# Patient Record
Sex: Female | Born: 1965 | Race: White | Hispanic: No | Smoking: Current every day smoker
Health system: Southern US, Community
[De-identification: ages and names within clinical notes are randomized; demographics above are authoritative.]

## PROBLEM LIST (undated history)

## (undated) DIAGNOSIS — F329 Major depressive disorder, single episode, unspecified: Secondary | ICD-10-CM

## (undated) DIAGNOSIS — J189 Pneumonia, unspecified organism: Secondary | ICD-10-CM

## (undated) DIAGNOSIS — G43909 Migraine, unspecified, not intractable, without status migrainosus: Secondary | ICD-10-CM

## (undated) DIAGNOSIS — F32A Depression, unspecified: Secondary | ICD-10-CM

## (undated) DIAGNOSIS — T07XXXA Unspecified multiple injuries, initial encounter: Secondary | ICD-10-CM

## (undated) DIAGNOSIS — M1990 Unspecified osteoarthritis, unspecified site: Secondary | ICD-10-CM

## (undated) DIAGNOSIS — C541 Malignant neoplasm of endometrium: Secondary | ICD-10-CM

## (undated) HISTORY — PX: TUBAL LIGATION: SHX77

## (undated) HISTORY — DX: Major depressive disorder, single episode, unspecified: F32.9

## (undated) HISTORY — DX: Depression, unspecified: F32.A

## (undated) HISTORY — DX: Unspecified osteoarthritis, unspecified site: M19.90

## (undated) HISTORY — DX: Migraine, unspecified, not intractable, without status migrainosus: G43.909

## (undated) HISTORY — DX: Unspecified multiple injuries, initial encounter: T07.XXXA

## (undated) HISTORY — PX: WRIST SURGERY: SHX841

---

## 2008-01-07 ENCOUNTER — Emergency Department (HOSPITAL_COMMUNITY): Admission: EM | Admit: 2008-01-07 | Discharge: 2008-01-07 | Payer: Self-pay | Admitting: Emergency Medicine

## 2010-10-09 ENCOUNTER — Ambulatory Visit (HOSPITAL_COMMUNITY): Admission: RE | Admit: 2010-10-09 | Discharge: 2010-10-09 | Payer: Self-pay | Admitting: Family Medicine

## 2011-05-01 ENCOUNTER — Other Ambulatory Visit (HOSPITAL_COMMUNITY): Payer: Self-pay | Admitting: Family Medicine

## 2011-05-01 DIAGNOSIS — Z09 Encounter for follow-up examination after completed treatment for conditions other than malignant neoplasm: Secondary | ICD-10-CM

## 2011-05-07 ENCOUNTER — Ambulatory Visit (HOSPITAL_COMMUNITY)
Admission: RE | Admit: 2011-05-07 | Discharge: 2011-05-07 | Disposition: A | Discharge status: Home / Self Care | Payer: PRIVATE HEALTH INSURANCE | Source: Ambulatory Visit | Attending: Family Medicine | Admitting: Family Medicine

## 2011-05-07 ENCOUNTER — Other Ambulatory Visit (HOSPITAL_COMMUNITY): Payer: Self-pay | Admitting: Family Medicine

## 2011-05-07 DIAGNOSIS — N6489 Other specified disorders of breast: Secondary | ICD-10-CM | POA: Insufficient documentation

## 2011-05-07 DIAGNOSIS — Z09 Encounter for follow-up examination after completed treatment for conditions other than malignant neoplasm: Secondary | ICD-10-CM | POA: Insufficient documentation

## 2018-07-22 ENCOUNTER — Other Ambulatory Visit: Payer: Self-pay

## 2018-07-22 ENCOUNTER — Emergency Department (HOSPITAL_COMMUNITY)
Admission: EM | Admit: 2018-07-22 | Discharge: 2018-07-22 | Disposition: A | Discharge status: Home / Self Care | Payer: Worker's Compensation | Attending: Emergency Medicine | Admitting: Emergency Medicine

## 2018-07-22 ENCOUNTER — Encounter (HOSPITAL_COMMUNITY): Payer: Self-pay | Admitting: Emergency Medicine

## 2018-07-22 ENCOUNTER — Emergency Department (HOSPITAL_COMMUNITY): Payer: Worker's Compensation

## 2018-07-22 DIAGNOSIS — W010XXA Fall on same level from slipping, tripping and stumbling without subsequent striking against object, initial encounter: Secondary | ICD-10-CM | POA: Diagnosis not present

## 2018-07-22 DIAGNOSIS — Y939 Activity, unspecified: Secondary | ICD-10-CM | POA: Insufficient documentation

## 2018-07-22 DIAGNOSIS — Y929 Unspecified place or not applicable: Secondary | ICD-10-CM | POA: Insufficient documentation

## 2018-07-22 DIAGNOSIS — S838X2A Sprain of other specified parts of left knee, initial encounter: Secondary | ICD-10-CM | POA: Diagnosis not present

## 2018-07-22 DIAGNOSIS — F17210 Nicotine dependence, cigarettes, uncomplicated: Secondary | ICD-10-CM | POA: Diagnosis not present

## 2018-07-22 DIAGNOSIS — Y990 Civilian activity done for income or pay: Secondary | ICD-10-CM | POA: Diagnosis not present

## 2018-07-22 DIAGNOSIS — S8992XA Unspecified injury of left lower leg, initial encounter: Secondary | ICD-10-CM | POA: Diagnosis present

## 2018-07-22 DIAGNOSIS — S8002XA Contusion of left knee, initial encounter: Secondary | ICD-10-CM

## 2018-07-22 MED ORDER — IBUPROFEN 600 MG PO TABS: 600.0000 mg | ORAL_TABLET | Freq: Four times a day (QID) | ORAL | 0 refills | Status: DC

## 2018-07-22 MED ORDER — HYDROXYZINE HCL 25 MG PO TABS
25.0000 mg | ORAL_TABLET | Freq: Once | ORAL | Status: AC
  Administered 2018-07-22: 25 mg via ORAL
  Filled 2018-07-22: qty 1

## 2018-07-22 MED ORDER — TRAMADOL HCL 50 MG PO TABS
100.0000 mg | ORAL_TABLET | Freq: Once | ORAL | Status: AC
  Administered 2018-07-22: 100 mg via ORAL
  Filled 2018-07-22: qty 2

## 2018-07-22 MED ORDER — IBUPROFEN 400 MG PO TABS
400.0000 mg | ORAL_TABLET | Freq: Once | ORAL | Status: AC
  Administered 2018-07-22: 400 mg via ORAL
  Filled 2018-07-22: qty 1

## 2018-07-22 MED ORDER — TRAMADOL HCL 50 MG PO TABS: 50.0000 mg | ORAL_TABLET | Freq: Four times a day (QID) | ORAL | 0 refills | Status: DC | PRN

## 2018-07-22 NOTE — Discharge Instructions (Signed)
Your x-ray is negative for fracture or dislocation.  There are some mild arthritis changes noted on your x-ray.  Your examination favors a contusion to your knee, and a sprain to your knee.  Please use the knee immobilizer over the next 5 to 7 days.  Use the crutches until you can safely apply weight to your left lower extremity.  Use ibuprofen 600 mg with breakfast, lunch, dinner, and at bedtime.  Use Ultram for more severe pain.  Please take this medication with food.  Please call Dr. Aline Brochure for orthopedic evaluation if this is not improving over the weekend.

## 2018-07-22 NOTE — ED Provider Notes (Signed)
[facility] EMERGENCY DEPARTMENT Provider Note   CSN: [id number] Arrival date & time: 07/22/18  1945     History   Chief Complaint Chief Complaint  Patient presents with  . Knee Pain    HPI Becky Sanchez is a 52 y.o. female.  Patient is a 52 year old female who presents to the emergency department with a complaint of left knee pain.  The patient states that while at work she fell from a standing position with most of her weight on the left knee.  This pain is getting progressively worse.  She cannot bear weight on the left lower extremity.  Patient denies any previous operations procedures involving the left lower extremity.  No other injury reported.  The history is provided by the patient.    History reviewed. No pertinent past medical history.  There are no active problems to display for this patient.   Past Surgical History:  Procedure Laterality Date  . TUBAL LIGATION    . WRIST SURGERY       OB History   None      Home Medications    Prior to Admission medications   Not on File    Family History No family history on file.  Social History Social History   Tobacco Use  . Smoking status: Current Every Day Smoker  . Smokeless tobacco: Never Used  Substance Use Topics  . Alcohol use: Not Currently  . Drug use: Never     Allergies   Percocet [oxycodone-acetaminophen]   Review of Systems Review of Systems  Constitutional: Negative for activity change.       All ROS Neg except as noted in HPI  HENT: Negative for nosebleeds.   Eyes: Negative for photophobia and discharge.  Respiratory: Negative for cough, shortness of breath and wheezing.   Cardiovascular: Negative for chest pain and palpitations.  Gastrointestinal: Negative for abdominal pain and blood in stool.  Genitourinary: Negative for dysuria, frequency and hematuria.  Musculoskeletal: Positive for arthralgias. Negative for back pain and neck pain.  Skin: Negative.   Neurological:  Negative for dizziness, seizures and speech difficulty.  Psychiatric/Behavioral: Negative for confusion and hallucinations.     Physical Exam Updated Vital Signs BP (!) 130/93 (BP Location: Right Arm)   Pulse 84   Temp 98.3 F (36.8 C) (Oral)   Resp 16   Ht 5\' 2"  (1.575 m)   Wt 68 kg (150 lb)   SpO2 98%   BMI 27.44 kg/m   Physical Exam  Constitutional: Vital signs are normal. She appears well-developed and well-nourished. She is active.  HENT:  Head: Normocephalic and atraumatic.  Right Ear: Tympanic membrane, external ear and ear canal normal.  Left Ear: Tympanic membrane, external ear and ear canal normal.  Nose: Nose normal.  Mouth/Throat: Uvula is midline, oropharynx is clear and moist and mucous membranes are normal.  Eyes: Pupils are equal, round, and reactive to light. Conjunctivae, EOM and lids are normal.  Neck: Trachea normal, normal range of motion and phonation normal. Neck supple. Carotid bruit is not present.  Cardiovascular: Normal rate, regular rhythm and normal pulses.  Abdominal: Soft. Normal appearance and bowel sounds are normal.  Musculoskeletal: She exhibits tenderness.  There is good range of motion of the left hip.  There is pain with attempted range of motion of the left knee.  There is no posterior mass appreciated.  There is mild swelling of the anterior tibial tuberosity.  There is no deformity of the quadricep area, and  no effusion of the knee.  The Achilles tendon is intact.  The dorsalis pedis pulses 2+ on the left.  Lymphadenopathy:       Head (right side): No submental, no preauricular and no posterior auricular adenopathy present.       Head (left side): No submental, no preauricular and no posterior auricular adenopathy present.    She has no cervical adenopathy.  Neurological: She is alert. She has normal strength. No cranial nerve deficit or sensory deficit. GCS eye subscore is 4. GCS verbal subscore is 5. GCS motor subscore is 6.  Skin: Skin  is warm and dry.  Psychiatric: Her speech is normal.     ED Treatments / Results  Labs (all labs ordered are listed, but only abnormal results are displayed) Labs Reviewed - No data to display  EKG None  Radiology Dg Knee Complete 4 Views Left  Result Date: 07/22/2018 CLINICAL DATA:  Left knee pain after fall at work. EXAM: LEFT KNEE - COMPLETE 4+ VIEW COMPARISON:  None. FINDINGS: No evidence of fracture, dislocation, or joint effusion. No evidence of arthropathy or other focal bone abnormality. Soft tissues are unremarkable. IMPRESSION: Normal left knee. Electronically Signed   By: Marijo Conception, M.D.   On: 07/22/2018 21:00    Procedures Procedures (including critical care time)  Medications Ordered in ED Medications  ibuprofen (ADVIL,MOTRIN) tablet 400 mg (has no administration in time range)  traMADol (ULTRAM) tablet 100 mg (has no administration in time range)  hydrOXYzine (ATARAX/VISTARIL) tablet 25 mg (has no administration in time range)     Initial Impression / Assessment and Plan / ED Course  I have reviewed the triage vital signs and the nursing notes.  Pertinent labs & imaging results that were available during my care of the patient were reviewed by me and considered in my medical decision making (see chart for details).       Final Clinical Impressions(s) / ED Diagnoses MDM  Vital signs reviewed.  Pulse oximetry is 98% on room air.  Within normal limits by my interpretation.  No gross neurovascular deficits appreciated of the lower extremities.  X-ray of the left knee is negative for fracture, dislocation, or effusion.  The examination suggest contusion to the knee and sprain of the left knee.  Patient was fitted with a knee immobilizer and crutches.  Ice pack was provided.  A prescription for ibuprofen 600 and Ultram 50 mg given to the patient.  The patient is to follow-up with Dr. Aline Brochure if this pain is not improving.   Final diagnoses:  Contusion of  left knee, initial encounter  Sprain of other ligament of left knee, initial encounter    ED Discharge Orders        Ordered    traMADol (ULTRAM) 50 MG tablet  Every 6 hours PRN     07/22/18 2220    ibuprofen (ADVIL,MOTRIN) 600 MG tablet  4 times daily     07/22/18 2220       Lily Kocher, PA-C 07/22/18 2235    Long, W[facility], MD 07/23/18 1452

## 2018-07-22 NOTE — ED Triage Notes (Signed)
Pt c/o left knee pain after falling at work. Pt states she tripped on some bags.

## 2018-08-16 ENCOUNTER — Telehealth: Payer: Self-pay | Admitting: Orthopedic Surgery

## 2018-08-16 NOTE — Telephone Encounter (Signed)
I've contacted patient and scheduled appointment per approval; awaiting form only. Patient aware of appointment.

## 2018-08-16 NOTE — Telephone Encounter (Signed)
Call received from worker's comp adjuster,  Nehemiah Settle, from [employer] - ph# [phone] / fax# [phone]; states following up on knee injury of this patient per visit to [facility] Emergency Room on  07/22/18.  Approving treatment for our clinic with either of our providers. Faxed our worker's comp set up form; appointment pending.

## 2018-08-17 NOTE — Telephone Encounter (Signed)
Forms and Claim information received: Claim # [id number], per adjuster Drenda Freeze, [employer], ph# [phone], [phone].  Adjuster and patient aware of appointment 08/24/18, 9:20a.m.

## 2018-08-24 ENCOUNTER — Encounter: Payer: Self-pay | Admitting: Orthopaedic Surgery

## 2018-08-24 ENCOUNTER — Ambulatory Visit (INDEPENDENT_AMBULATORY_CARE_PROVIDER_SITE_OTHER): Payer: Worker's Compensation | Admitting: Orthopaedic Surgery

## 2018-08-24 VITALS — BP 116/70 | HR 70 | Ht 62.0 in | Wt 161.0 lb

## 2018-08-24 DIAGNOSIS — M25562 Pain in left knee: Secondary | ICD-10-CM | POA: Diagnosis not present

## 2018-08-24 MED ORDER — NAPROXEN 500 MG PO TABS: 500.0000 mg | ORAL_TABLET | Freq: Two times a day (BID) | ORAL | 5 refills | Status: DC

## 2018-08-24 NOTE — Progress Notes (Signed)
Subjective:    Patient ID: Becky Sanchez, female    DOB: 04/15/1966, 52 y.o.   MRN: [id number]  HPI  She fell at work at [employer] in [place] on 07-22-18 around 6:30 pm and hurt her left knee.  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 Her right foot got tangled in the straps of the delivery bags.  She fell and landed on her left knee hurting the left knee.  She reported the injury to [facility], Freight forwarder on Duty. She was seen at [place] the night of the injury.  X-rays of the knee were done and were negative.  She was given ibuprofen and Ultram.  She continues to have left knee pain.  She has swelling and the tendency for the knee to give way.  She has worked but is working a less than normal schedule.  She denies prior problems with the knee.  She is taking two Aleve a day for the knee.  She has a history of migraines and is limited to what medicine she can take.  She has no redness, no numbness, no other injury.  I have reviewed the ER records, the X-rays and the X-rays report as well as the information from [employer]    Review of Systems  Constitutional: Positive for activity change.  Musculoskeletal: Positive for arthralgias, gait problem and joint swelling.  Neurological: Positive for headaches.  All other systems reviewed and are negative.  For Review of Systems, all other systems reviewed and are negative.  The following is a summary of the past history medically, past history surgically, known current medicines, social history and family history.  This information is gathered electronically by the computer from prior information and documentation.  I review this each visit and have found including this information at this point in the chart is beneficial and informative.   Past Medical History:  Diagnosis Date  . Arthritis   . Depression   . Fractures   . Migraine     Past Surgical History:  Procedure Laterality Date    . TUBAL LIGATION    . WRIST SURGERY      Current Outpatient Medications on File Prior to Visit  Medication Sig Dispense Refill  . ibuprofen (ADVIL,MOTRIN) 600 MG tablet Take 1 tablet (600 mg total) by mouth 4 (four) times daily. 30 tablet 0  . traMADol (ULTRAM) 50 MG tablet Take 1 tablet (50 mg total) by mouth every 6 (six) hours as needed. 12 tablet 0   No current facility-administered medications on file prior to visit.     Social History   Socioeconomic History  . Marital status: Divorced    Spouse name: Not on file  . Number of children: Not on file  . Years of education: Not on file  . Highest education level: Not on file  Occupational History  . Not on file  Social Needs  . Financial resource strain: Not on file  . Food insecurity:    Worry: Not on file    Inability: Not on file  . Transportation needs:    Medical: Not on file    Non-medical: Not on file  Tobacco Use  . Smoking status: Current Every Day Smoker  . Smokeless tobacco: Never Used  Substance and Sexual Activity  . Alcohol use: Not Currently  . Drug use: Never  . Sexual activity: Not on file  Lifestyle  . Physical activity:  Days per week: Not on file    Minutes per session: Not on file  . Stress: Not on file  Relationships  . Social connections:    Talks on phone: Not on file    Gets together: Not on file    Attends religious service: Not on file    Active member of club or organization: Not on file    Attends meetings of clubs or organizations: Not on file    Relationship status: Not on file  . Intimate partner violence:    Fear of current or ex partner: Not on file    Emotionally abused: Not on file    Physically abused: Not on file    Forced sexual activity: Not on file  Other Topics Concern  . Not on file  Social History Narrative  . Not on file    Family History  Problem Relation Age of Onset  . Thyroid disease Mother   . Heart disease Maternal Grandmother   . Alzheimer's  disease Maternal Grandmother   . Dementia Maternal Grandfather   . Cancer Paternal Grandfather     BP 116/70   Pulse 70   Ht 5\' 2"  (1.575 m)   Wt 161 lb (73 kg)   BMI 29.45 kg/m   Body mass index is 29.45 kg/m.     Objective:   Physical Exam  Constitutional: She is oriented to person, place, and time. She appears well-developed and well-nourished.  HENT:  Head: Normocephalic and atraumatic.  Eyes: Pupils are equal, round, and reactive to light. Conjunctivae and EOM are normal.  Neck: Normal range of motion. Neck supple.  Cardiovascular: Normal rate, regular rhythm and intact distal pulses.  Pulmonary/Chest: Effort normal.  Abdominal: Soft.  Musculoskeletal:       Left knee: Tenderness found. Medial joint line tenderness noted.       Legs: Neurological: She is alert and oriented to person, place, and time. She has normal reflexes. She displays normal reflexes. No cranial nerve deficit. She exhibits normal muscle tone. Coordination normal.  Skin: Skin is warm and dry.  Psychiatric: She has a normal mood and affect. Her behavior is normal. Judgment and thought content normal.          Assessment & Plan:   Encounter Diagnosis  Name Primary?  . Acute pain of left knee Yes   I have given Rx for Naprosyn 500 po bid pc.  Stop the Aleve.  Avoid squatting.  Return in two weeks.  Re-evaluate then. She might knee MRI if not improved.  Call if any problem.  Precautions discussed.   Electronically Signed Sanjuana Kava, MD 8/27/201910:37 AM

## 2018-09-07 ENCOUNTER — Ambulatory Visit: Payer: Self-pay | Admitting: Orthopaedic Surgery

## 2018-09-07 ENCOUNTER — Encounter: Payer: Self-pay | Admitting: Orthopaedic Surgery

## 2018-11-09 ENCOUNTER — Telehealth: Payer: Self-pay | Admitting: Orthopaedic Surgery

## 2018-11-09 NOTE — Telephone Encounter (Signed)
Call received from adjuster on worker's comp claim, Sherri S. Ph [phone] via voice mail. States following up on whether patient had ever returned for the follow up visit, which at time of initial visit, was to be seen in 2 weeks. I returned call and re-iterated as had been previously faxed and discussed by phone, that patient did not show for the 2-week follow up; therefore, only 1 visit, notes for which had been faxed (see in"Release of Information".  Patient had not responded to call or letter that was sent.

## 2020-01-08 IMAGING — DX DG KNEE COMPLETE 4+V*L*
4 series · 4 of 4 positions shown · non-contrast
Comparison: None.

CLINICAL DATA: Left knee pain after fall at work.

EXAM:
LEFT KNEE - COMPLETE 4+ VIEW

[knee ap (1 of 3)]
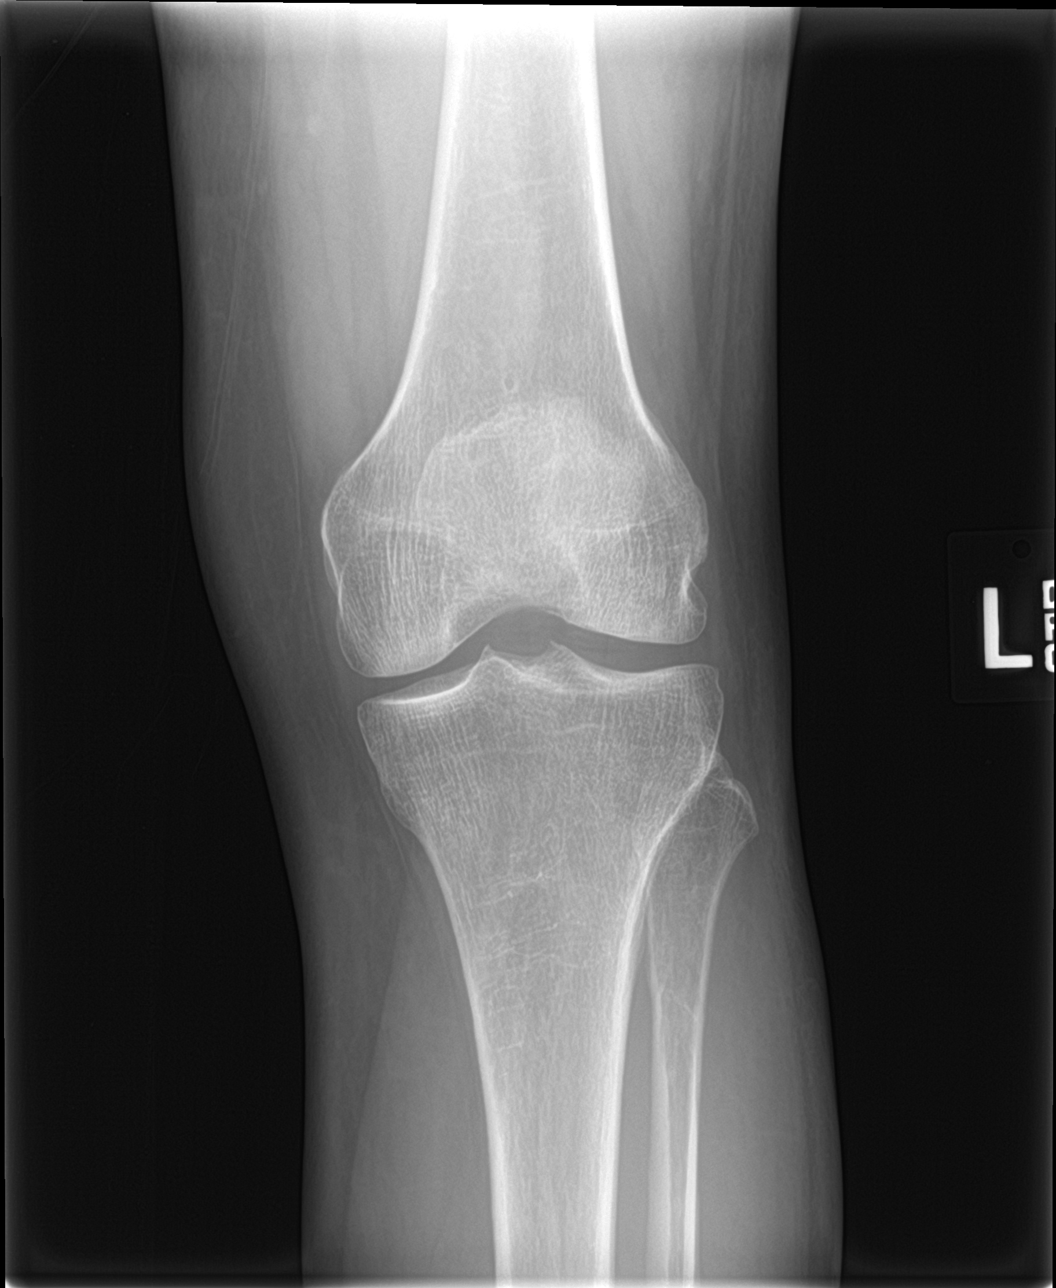

[knee lat]
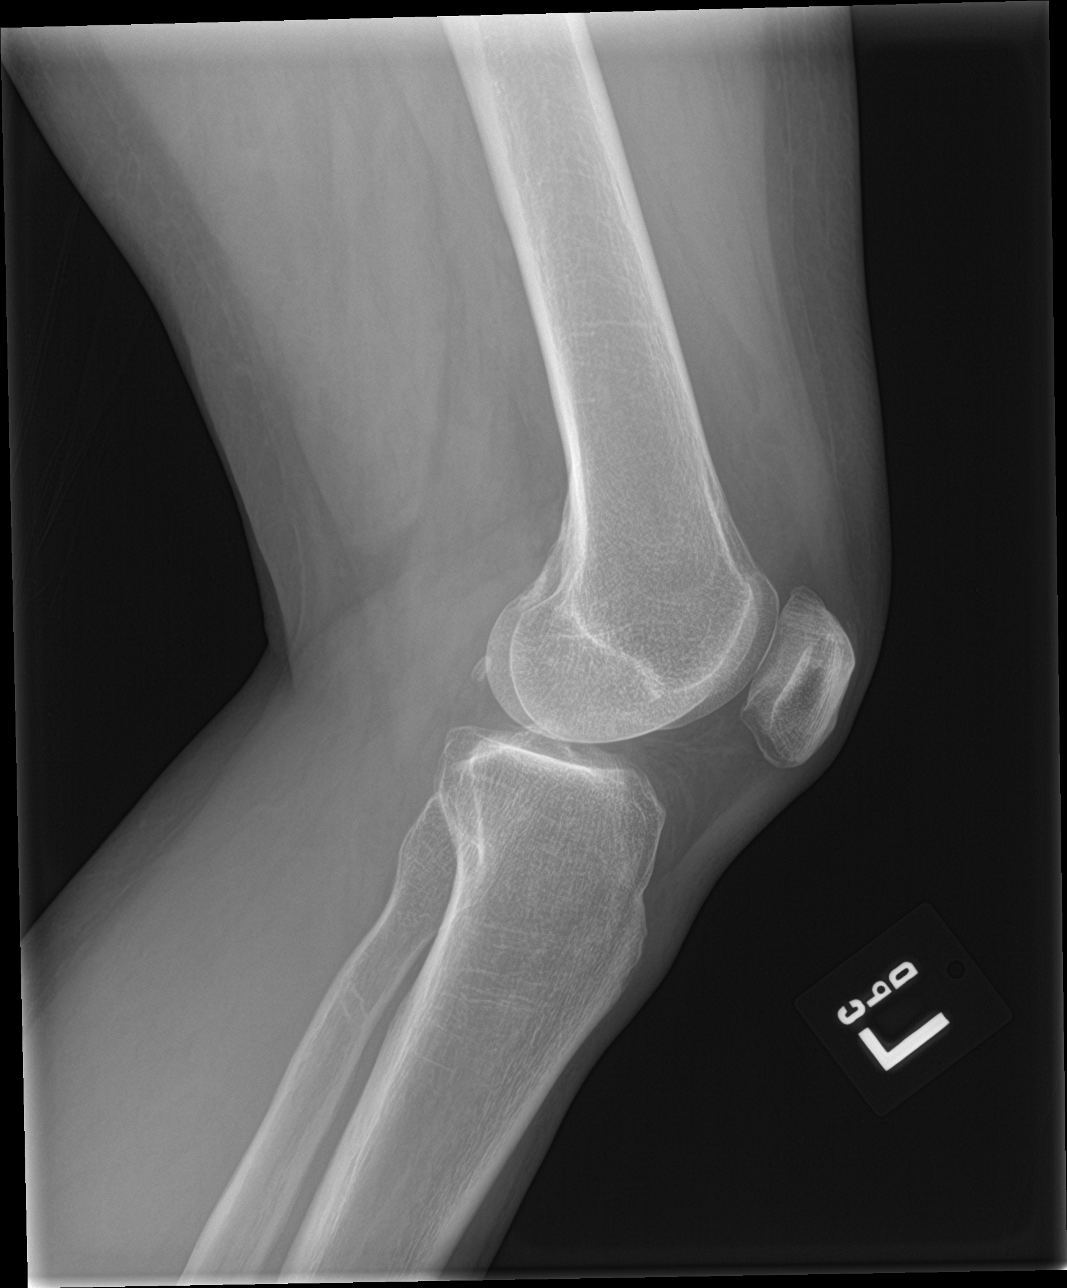

[knee ap (2 of 3)]
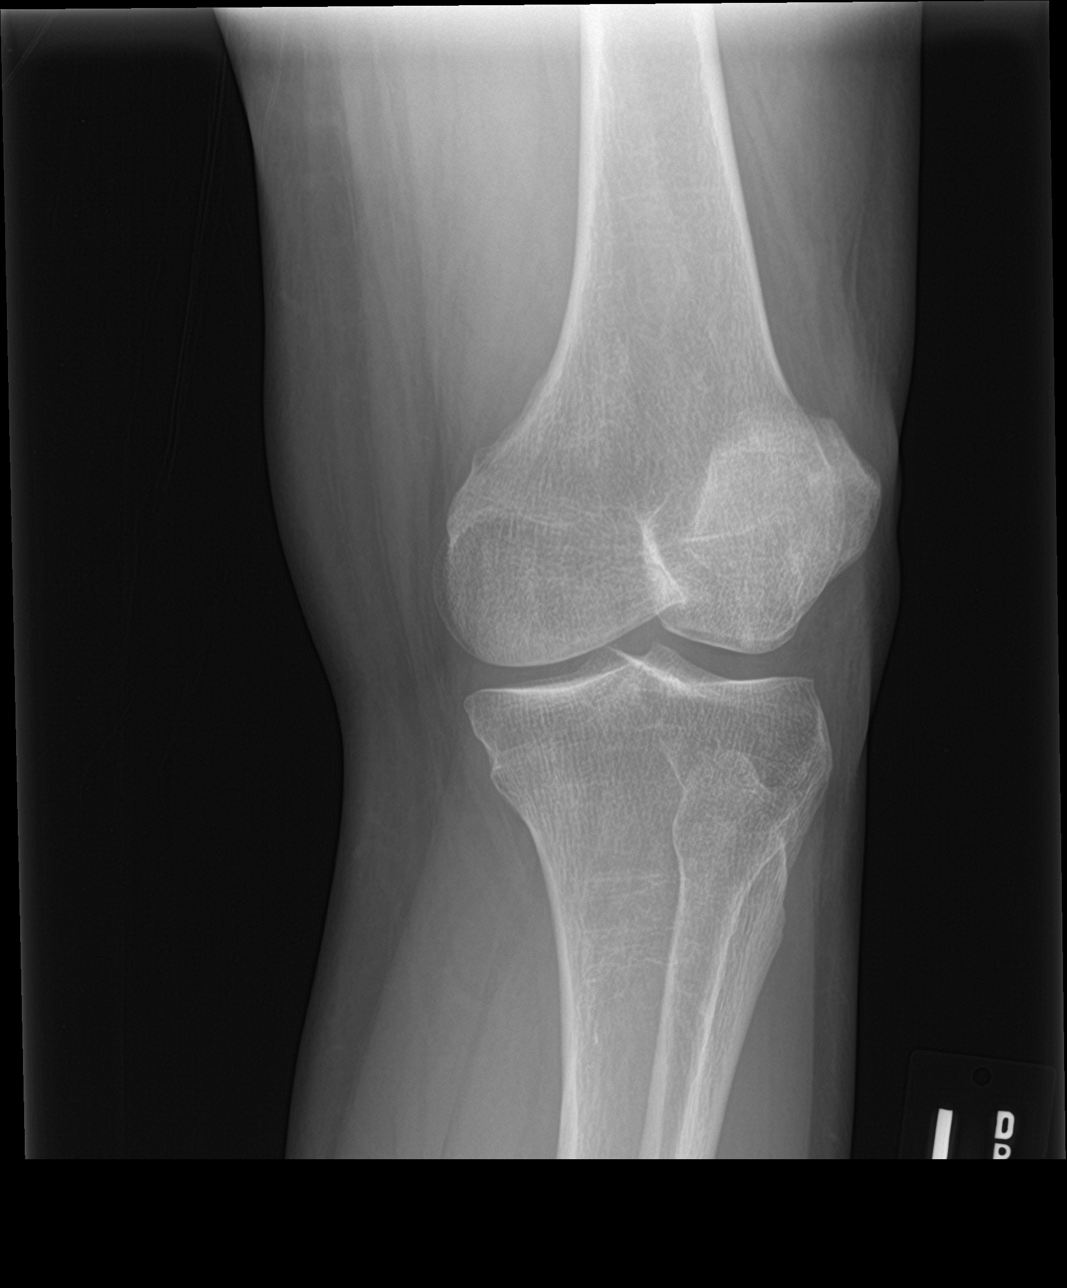

[knee ap (3 of 3)]
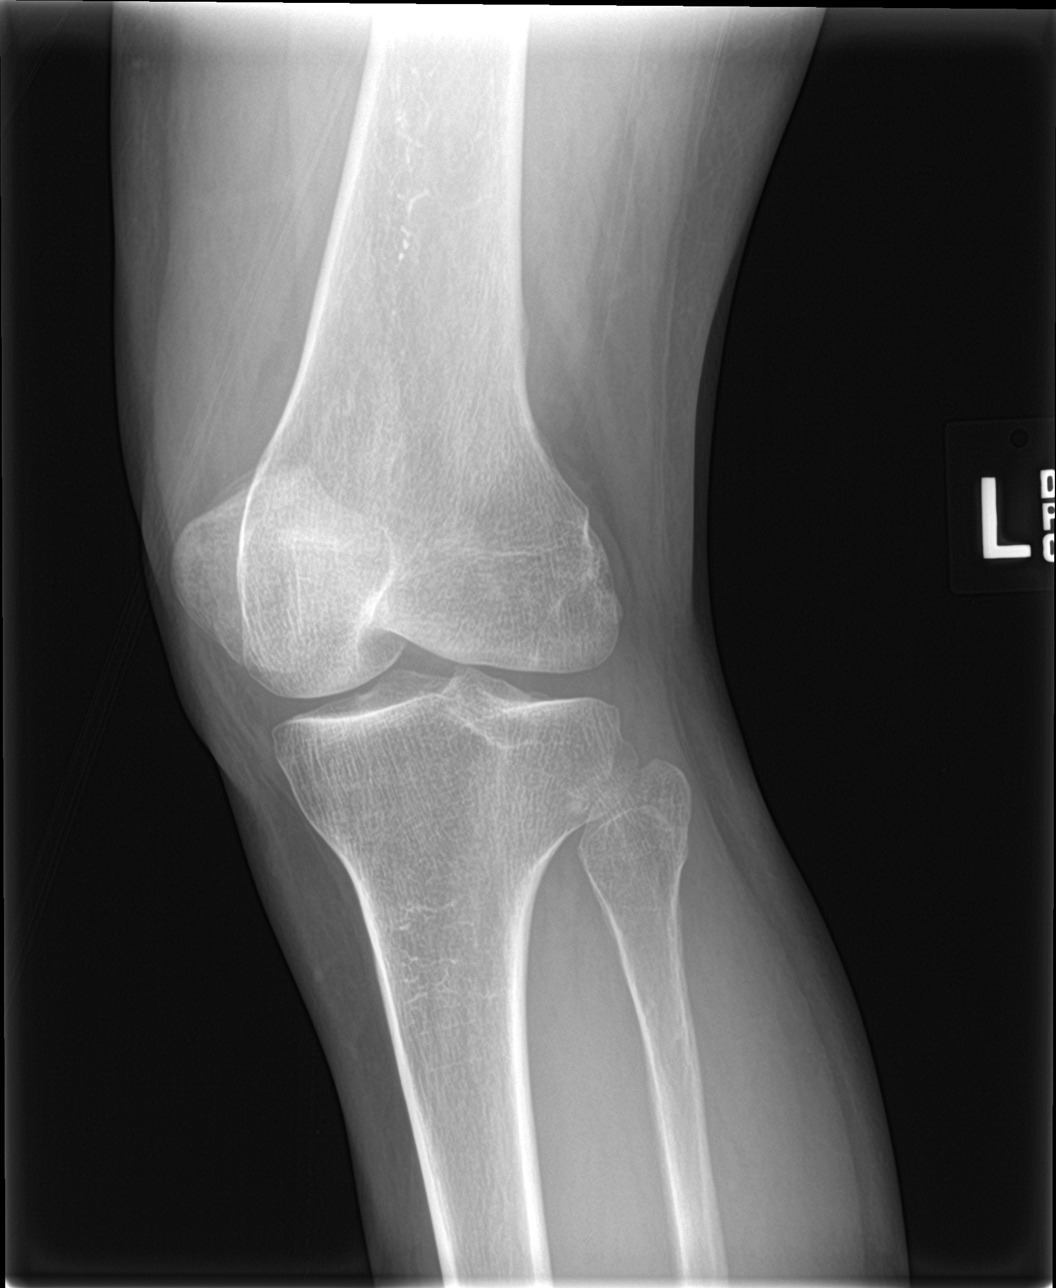

[4 of 4 positions shown; findings below may reference images not displayed]

FINDINGS: No evidence of fracture, dislocation, or joint effusion. No evidence
of arthropathy or other focal bone abnormality. Soft tissues are
unremarkable.
IMPRESSION: Normal left knee.

## 2023-01-09 ENCOUNTER — Telehealth: Payer: Self-pay | Admitting: *Deleted

## 2023-01-09 NOTE — Telephone Encounter (Signed)
Spoke with the patient regarding the referral to GYN oncology. Patient scheduled as new patient with Dr Ernestina Patches at 1/29 at 11/15 am. Patient given an arrival time of 10:45 am.   Explained to the patient the the doctor will perform a pelvic exam at this visit. Patient given the policy that no visitors under the 16 yrs are allowed in the [place]. Patient given the address/phone number for the clinic and that the center offers free valet service. Patient aware of the new mask mandate.

## 2023-01-22 ENCOUNTER — Encounter: Payer: Self-pay | Admitting: Psychiatry

## 2023-01-26 ENCOUNTER — Other Ambulatory Visit (HOSPITAL_COMMUNITY)
Admission: RE | Admit: 2023-01-26 | Discharge: 2023-01-26 | Disposition: A | Discharge status: Home / Self Care | Payer: PRIVATE HEALTH INSURANCE | Source: Ambulatory Visit | Attending: Psychiatry | Admitting: Psychiatry

## 2023-01-26 ENCOUNTER — Inpatient Hospital Stay (HOSPITAL_BASED_OUTPATIENT_CLINIC_OR_DEPARTMENT_OTHER): Payer: BC Managed Care – PPO | Admitting: Gynecologic Oncology

## 2023-01-26 ENCOUNTER — Inpatient Hospital Stay: Payer: BC Managed Care – PPO | Attending: Psychiatry | Admitting: Psychiatry

## 2023-01-26 ENCOUNTER — Encounter: Payer: Self-pay | Admitting: Psychiatry

## 2023-01-26 ENCOUNTER — Other Ambulatory Visit: Payer: Self-pay

## 2023-01-26 ENCOUNTER — Inpatient Hospital Stay: Payer: BC Managed Care – PPO

## 2023-01-26 VITALS — BP 138/85 | HR 84 | Temp 98.5°F | Resp 14 | Ht 62.21 in | Wt 163.0 lb

## 2023-01-26 DIAGNOSIS — Z720 Tobacco use: Secondary | ICD-10-CM

## 2023-01-26 DIAGNOSIS — Z8042 Family history of malignant neoplasm of prostate: Secondary | ICD-10-CM | POA: Diagnosis not present

## 2023-01-26 DIAGNOSIS — R350 Frequency of micturition: Secondary | ICD-10-CM

## 2023-01-26 DIAGNOSIS — R634 Abnormal weight loss: Secondary | ICD-10-CM | POA: Insufficient documentation

## 2023-01-26 DIAGNOSIS — C541 Malignant neoplasm of endometrium: Secondary | ICD-10-CM

## 2023-01-26 DIAGNOSIS — Z124 Encounter for screening for malignant neoplasm of cervix: Secondary | ICD-10-CM

## 2023-01-26 DIAGNOSIS — G893 Neoplasm related pain (acute) (chronic): Secondary | ICD-10-CM | POA: Diagnosis not present

## 2023-01-26 DIAGNOSIS — R6881 Early satiety: Secondary | ICD-10-CM | POA: Diagnosis not present

## 2023-01-26 DIAGNOSIS — N950 Postmenopausal bleeding: Secondary | ICD-10-CM | POA: Insufficient documentation

## 2023-01-26 DIAGNOSIS — F17210 Nicotine dependence, cigarettes, uncomplicated: Secondary | ICD-10-CM

## 2023-01-26 DIAGNOSIS — M1990 Unspecified osteoarthritis, unspecified site: Secondary | ICD-10-CM | POA: Diagnosis not present

## 2023-01-26 DIAGNOSIS — Z716 Tobacco abuse counseling: Secondary | ICD-10-CM

## 2023-01-26 LAB — URINALYSIS, COMPLETE (UACMP) WITH MICROSCOPIC
Bacteria, UA: NONE SEEN
Bilirubin Urine: NEGATIVE
Glucose, UA: NEGATIVE mg/dL
Hgb urine dipstick: NEGATIVE
Ketones, ur: NEGATIVE mg/dL
Nitrite: NEGATIVE
Protein, ur: NEGATIVE mg/dL
Specific Gravity, Urine: 1.009 (ref 1.005–1.030)
pH: 5 (ref 5.0–8.0)

## 2023-01-26 MED ORDER — SENNOSIDES-DOCUSATE SODIUM 8.6-50 MG PO TABS: 2.0000 | ORAL_TABLET | Freq: Every day | ORAL | 0 refills | Status: AC

## 2023-01-26 MED ORDER — NICOTINE POLACRILEX 2 MG MT LOZG: 2.0000 mg | LOZENGE | OROMUCOSAL | 3 refills | Status: DC | PRN

## 2023-01-26 MED ORDER — NICOTINE 21 MG/24HR TD PT24: 21.0000 mg | MEDICATED_PATCH | Freq: Every day | TRANSDERMAL | 3 refills | Status: DC

## 2023-01-26 MED ORDER — TRAMADOL HCL 50 MG PO TABS: 50.0000 mg | ORAL_TABLET | Freq: Four times a day (QID) | ORAL | 0 refills | Status: DC | PRN

## 2023-01-26 NOTE — H&P (View-Only) (Signed)
GYNECOLOGIC ONCOLOGY NEW PATIENT CONSULTATION  Date of Service: 01/26/2023 Referring Provider: Judy Pimple, NP [address] [place],  [place] [address]   ASSESSMENT AND PLAN: Becky Sanchez is a 57 y.o. woman with FIGO grade 1 endometrial cancer.  We reviewed the nature of endometrial cancer and its recommended surgical staging, including total hysterectomy, bilateral salpingo-oophorectomy, and lymph node assessment. The patient is a suitable candidate for staging via a minimally invasive approach to surgery.  We reviewed that robotic assistance would be used to complete the surgery.   We discussed that most endometrial cancer is detected early and that decisions regarding adjuvant therapy will be made based on her final pathology.   Given her symptoms of early satiety and weight loss as well as her postmenopausal bleeding for over 2 years, recommend CT abdomen/pelvis prior to surgery.  Patient aware that pending results of CT scan, we may or may not proceed with surgery and instead could require neoadjuvant treatment if distant metastatic disease.  We reviewed the sentinel lymph node technique. Risks and benefits of sentinel lymph node biopsy was reviewed. We reviewed the technique and ICG dye. The patient DOES NOT have an iodine allergy or known liver dysfunction. We reviewed the false negative rate (0.4%), and that 3% of patients with metastatic disease will not have it detected by SLN biopsy in endometrial cancer. A low risk of allergic reaction to the dye, <0.2% for ICG, has been reported. We also discussed that in the case of failed mapping, which occurs 40% of the time, a bilateral or unilateral lymphadenectomy will be performed at the surgeon's discretion.   Potential benefits of sentinel nodes including a higher detection rate for metastasis due to ultrastaging and potential reduction in operative morbidity. However, there remains uncertainty as to the role for treatment of micrometastatic  disease. Further, the benefit of operative morbidity associated with the SLN technique in endometrial cancer is not yet completely known. In other patient populations (e.g. the cervical cancer population) there has been observed reductions in morbidity with SLN biopsy compared to pelvic lymphadenectomy. Lymphedema, nerve dysfunction and lymphocysts are all potential risks with the SLN technique as with complete lymphadenectomy. Additional risks to the patient include the risk of damage to an internal organ while operating in an altered view (e.g. the black and white image of the robotic fluorescence imaging mode).   Patient was consented for: Robotic assisted total laparoscopic hysterectomy, bilateral salpingo-oophorectomy, sentinel lymph node evaluation and biopsy, possible lymph node dissection, possible laparotomy on 02/24/2023.  The risks of surgery were discussed in detail and she understands these to including but not limited to bleeding requiring a blood transfusion, infection, injury to adjacent organs (including but not limited to the bowels, bladder, ureters, nerves, blood vessels), thromboembolic events, wound separation, hernia, vaginal cuff separation, possible risk of lymphedema and lymphocyst if lymphadenectomy performed, unforseen complication, and possible need for re-exploration.  If the patient experiences any of these events, she understands that her hospitalization or recovery may be prolonged and that she may need to take additional medications for a prolonged period. The patient will receive DVT and antibiotic prophylaxis as indicated. She voiced a clear understanding. She had the opportunity to ask questions and written informed consent was obtained today. She wishes to proceed.  She will proceed to the lab today for urinalysis given urinary frequency. Also reviewed tobacco cessation.  Prescription for nicotine patch and lozenges prescribed. Also, unclear when patient's last Pap  smear was.  No Pap  smear is listed in chart.  Pap smear collected today. She does not require preoperative clearance. Her METs are >4.  All preoperative instructions were reviewed. Postoperative expectations were also reviewed.    A copy of this note was sent to the patient's referring provider.  [name], MD Gynecologic Oncology   Medical Decision Making I personally spent  TOTAL 55 minutes face-to-face and non-face-to-face in the care of this patient, which includes all pre, intra, and post visit time on the date of service.  ------------  CC: Endometrial cancer  HISTORY OF PRESENT ILLNESS:  Becky Sanchez is a 57 y.o. woman who is seen in consultation at the request of A, Victorino December, NP for evaluation of a new diagnosis of endometrial cancer.  Patient underwent an ultrasound on 09/10/2021 for 3 months of dysfunctional uterine bleeding and was noted at that time to a thickened endometrium was 20 mm.  The patient believes that the biopsy of some sort may have been collected at that time, however, no results are available to confirm this.  She has since presented to her OB/GYN for 2 years of postmenopausal bleeding. Patient underwent a biopsy in 01/05/2023 which returned with FIGO stage I endometrioid adenocarcinoma.  Today, patient reports light ongoing vaginal bleeding.  She also notes pelvic pain that she describes as feeling like a knot since her biopsy.  During that same time she notes increased urinary frequency but denies urinary urgency, dysuria, hematuria.  She endorses early satiety for several years and reports about a 10 pound weight loss over the past year without any coinciding lifestyle changes.  She otherwise denies abdominal bloating or change in bowel habits.  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  PAST MEDICAL HISTORY: Past Medical History:  Diagnosis Date   Arthritis    Depression    Fractures    Migraine     PAST  SURGICAL HISTORY: Past Surgical History:  Procedure Laterality Date   TUBAL LIGATION     WRIST SURGERY Right     OB/GYN HISTORY: OB History  Gravida Para Term Preterm AB Living  '2 2 2     2  '$ SAB IAB Ectopic Multiple Live Births          2    # Outcome Date GA Lbr Len/2nd Weight Sex Delivery Anes PTL Lv  2 Term      Vag-Spont   LIV  1 Term      Vag-Spont   LIV      Age at menarche: 89 or 58 Age at menopause: Unknown due to bleeding Hx of HRT: None Hx of STI: gonorrhea many years ago Last pap: unsure History of abnormal pap smears: none that she knows of  SCREENING STUDIES:  Last mammogram: 09/2010 Last colonoscopy: none  MEDICATIONS:  Current Outpatient Medications:    naproxen sodium (ALEVE) 220 MG tablet, Take 220 mg by mouth., Disp: , Rfl:    OVER THE COUNTER MEDICATION, Take by mouth daily. Move Free Joint health Advance, Disp: , Rfl:   ALLERGIES: Allergies  Allergen Reactions   Percocet [Oxycodone-Acetaminophen] Hives    FAMILY HISTORY: Family History  Problem Relation Age of Onset   Thyroid disease Mother    Heart disease Maternal Grandmother    Alzheimer's disease Maternal Grandmother    Dementia Maternal Grandfather    Prostate cancer Paternal Grandfather    Breast cancer Neg Hx    Ovarian cancer Neg Hx    Endometrial cancer  Neg Hx    Colon cancer Neg Hx     SOCIAL HISTORY: Social History   Socioeconomic History   Marital status: Divorced    Spouse name: Not on file   Number of children: Not on file   Years of education: Not on file   Highest education level: Not on file  Occupational History   Not on file  Tobacco Use   Smoking status: Every Day    Packs/day: 1.00    Years: 30.00    Total pack years: 30.00    Types: Cigarettes   Smokeless tobacco: Never  Substance and Sexual Activity   Alcohol use: Yes    Comment: Occ   Drug use: Never   Sexual activity: Yes  Other Topics Concern   Not on file  Social History Narrative   Not on  file   Social Determinants of Health   Financial Resource Strain: Not on file  Food Insecurity: Not on file  Transportation Needs: Not on file  Physical Activity: Not on file  Stress: Not on file  Social Connections: Not on file  Intimate Partner Violence: Not on file    REVIEW OF SYSTEMS: New patient intake form was reviewed.  Complete 10-system review is negative except for the following: Pelvic pain  PHYSICAL EXAM: BP 138/85 (BP Location: Left Arm, Patient Position: Sitting)   Pulse 84   Temp 98.5 F (36.9 C) (Oral)   Resp 14   Ht 5' 2.21" (1.58 m)   Wt 163 lb (73.9 kg)   SpO2 100%   BMI 29.62 kg/m  Constitutional: No acute distress. Neuro/Psych: Alert, oriented.  Head and Neck: Normocephalic, atraumatic. Neck symmetric without masses. Sclera anicteric.  Respiratory: Normal work of breathing. Clear to auscultation bilaterally. Cardiovascular: Regular rate and rhythm, no murmurs, rubs, or gallops. Abdomen: Normoactive bowel sounds. Soft, non-distended, non-tender to palpation. No masses or hepatosplenomegaly appreciated. No evidence of hernia. No palpable fluid wave.  Extremities: Grossly normal range of motion. Warm, well perfused. No edema bilaterally. Skin: No rashes or lesions. Lymphatic: No cervical, supraclavicular, or inguinal adenopathy. Genitourinary:External genitalia without lesions. Urethral meatus without lesions or prolapse. On speculum exam, vagina and cervix without lesions.  Pap smear collected bimanual exam reveals anteverted mildly enlarged mobile uterus.  No adnexal masses or tenderness. Rectovaginal exam confirms the above findings and reveals normal sphincter tone and no masses or nodularity. Exam chaperoned by Joylene John, NP   LABORATORY AND RADIOLOGIC DATA: Outside medical records were reviewed to synthesize the above history, along with the history and physical obtained during the visit.  Outside laboratory, pathology, and imaging reports were  reviewed, with pertinent results below.    No results found for: "WBC", "HGB", "HCT", "PLT", "LDH", "MG", "CREATININE", "AST", "ALT", "CAN125", "CA125", "CEA", "AFPTM", "CA199", "HCGTM", "HPV"  Surgical pathology (01/05/23): Diagnosis   Specimen 1: Endometrium, biopsy -  ENDOMETRIOID ADENOCARCINOMA, FIGO GRADE 1, WITHIN  A BACKGROUND OF ATYPICAL HYPERPLASIA.  SEE COMMENT   Pelvic ultrasound (09/09/21), outside: EXAM:  TRANSABDOMINAL AND TRANSVAGINAL ULTRASOUND OF PELVIS   TECHNIQUE:  Both transabdominal and transvaginal ultrasound examinations of the  pelvis were performed. Transabdominal technique was performed for  global imaging of the pelvis including uterus, ovaries, adnexal  regions, and pelvic cul-de-sac. It was necessary to proceed with  endovaginal exam following the transabdominal exam to visualize the  endometrium and ovaries.   COMPARISON:  None   FINDINGS:  Uterus   Measurements: 8.2 x 5.0 x 6.7 cm = volume: 146 mL.  0.8 cm hypoechoic  structure in the lower uterine segment is favored to be a small  fibroid   Endometrium   Thickness: 20 mm.  No focal abnormality visualized.   Right ovary   Measurements: 2.3 x 1.7 x 1.6 cm = volume: 3.2 mL. Normal  appearance/no adnexal mass.   Left ovary   Measurements: 1.8 x 1.5 x 1.8 cm = volume: 2.5 mL. Normal  appearance/no adnexal mass.   Other findings   No abnormal free fluid.   IMPRESSION:  Thickened endometrium measuring up to 20 mm, likely related to phase  of menstruation, given patient's premenopausal status. Follow-up  ultrasound should be performed in 8-12 weeks to document normal  endometrial thickness.

## 2023-01-26 NOTE — Patient Instructions (Signed)
Plan to have a CT scan prior to surgery and you will be notified with the results.   Preparing for your Surgery   Plan for surgery on February 24, 2023 with Dr. [name] at [facility] will be scheduled for robotic assisted total laparoscopic hysterectomy (removal of the uterus and cervix), bilateral salpingo-oophorectomy (removal of both ovaries and fallopian tubes), sentinel lymph node biopsy, possible lymph node dissection, possible laparotomy (larger incision on your abdomen if needed).    Pre-operative Testing -You will receive a phone call from presurgical testing at [facility] to arrange for a pre-operative appointment and lab work.   -Bring your insurance card, copy of an advanced directive if applicable, medication list   -At that visit, you will be asked to sign a consent for a possible blood transfusion in case a transfusion becomes necessary during surgery.  The need for a blood transfusion is rare but having consent is a necessary part of your care.      -You should not be taking blood thinners or aspirin at least ten days prior to surgery unless instructed by your surgeon.   -Do not take supplements such as fish oil (omega 3), red yeast rice, turmeric before your surgery. You want to avoid medications with aspirin in them including headache powders such as BC or Goody's), Excedrin migraine.   Day Before Surgery at [place] [facility] will be asked to take in a light diet the day before surgery. You will be advised you can have clear liquids up until 3 hours before your surgery.     Eat a light diet the day before surgery.  Examples including soups, broths, toast, yogurt, mashed potatoes.  AVOID GAS PRODUCING FOODS AND BEVERAGES. Things to avoid include carbonated beverages (fizzy beverages, sodas), raw fruits and raw vegetables (uncooked), or beans.    If your bowels are filled with gas, your surgeon will have difficulty visualizing your pelvic organs which  increases your surgical risks.   Your role in recovery Your role is to become active as soon as directed by your doctor, while still giving yourself time to heal.  Rest when you feel tired. You will be asked to do the following in order to speed your recovery:   - Cough and breathe deeply. This helps to clear and expand your lungs and can prevent pneumonia after surgery.  - [place]. Do mild physical activity. Walking or moving your legs help your circulation and body functions return to normal. Do not try to get up or walk alone the first time after surgery.   -If you develop swelling on one leg or the other, pain in the back of your leg, redness/warmth in one of your legs, please call the office or go to the Emergency Room to have a doppler to rule out a blood clot. For shortness of breath, chest pain-seek care in the Emergency Room as soon as possible. - Actively manage your pain. Managing your pain lets you move in comfort. We will ask you to rate your pain on a scale of zero to 10. It is your responsibility to tell your doctor or nurse where and how much you hurt so your pain can be treated.   Special Considerations -If you are diabetic, you may be placed on insulin after surgery to have closer control over your blood sugars to promote healing and recovery.  This does not mean that you will be discharged on insulin.  If  applicable, your oral antidiabetics will be resumed when you are tolerating a solid diet.   -Your final pathology results from surgery should be available around one week after surgery and the results will be relayed to you when available.   -Dr. Lahoma Crocker is the surgeon that assists your GYN Oncologist with surgery.  If you end up staying the night, the next day after your surgery you will either see Dr. Berline Lopes, Dr. Ernestina Patches, or Dr. Lahoma Crocker.   -FMLA forms can be faxed to [phone] and please allow 5-7 business days for completion.    Pain Management After Surgery -You have been prescribed your pain medication and bowel regimen medications before surgery so that you can have these available when you are discharged from the hospital. The pain medication is for use ONLY AFTER surgery and a new prescription will not be given.    -Make sure that you have Tylenol and Ibuprofen (OR ALEVE, DO NOT TAKE TOGETHER) IF YOU ARE ABLE TO TAKE THESE MEDICATIONS at home to use on a regular basis after surgery for pain control. We recommend alternating the medications every hour to six hours since they work differently and are processed in the body differently for pain relief.   -Review the attached handout on narcotic use and their risks and side effects.    Bowel Regimen -You have been prescribed Sennakot-S to take nightly to prevent constipation especially if you are taking the narcotic pain medication intermittently.  It is important to prevent constipation and drink adequate amounts of liquids. You can stop taking this medication when you are not taking pain medication and you are back on your normal bowel routine.   Risks of Surgery Risks of surgery are low but include bleeding, infection, damage to surrounding structures, re-operation, blood clots, and very rarely death.     Blood Transfusion Information (For the consent to be signed before surgery)   We will be checking your blood type before surgery so in case of emergencies, we will know what type of blood you would need.                                             WHAT IS A BLOOD TRANSFUSION?   A transfusion is the replacement of blood or some of its parts. Blood is made up of multiple cells which provide different functions. Red blood cells carry oxygen and are used for blood loss replacement. White blood cells fight against infection. Platelets control bleeding. Plasma helps clot blood. Other blood products are available for specialized needs, such as hemophilia or other  clotting disorders. BEFORE THE TRANSFUSION  Who gives blood for transfusions?  You may be able to donate blood to be used at a later date on yourself (autologous donation). Relatives can be asked to donate blood. This is generally not any safer than if you have received blood from a stranger. The same precautions are taken to ensure safety when a relative's blood is donated. Healthy volunteers who are fully evaluated to make sure their blood is safe. This is blood bank blood. Transfusion therapy is the safest it has ever been in the practice of medicine. Before blood is taken from a donor, a complete history is taken to make sure that person has no history of diseases nor engages in risky social behavior (examples are intravenous drug use or sexual activity  with multiple partners). The donor's travel history is screened to minimize risk of transmitting infections, such as malaria. The donated blood is tested for signs of infectious diseases, such as HIV and hepatitis. The blood is then tested to be sure it is compatible with you in order to minimize the chance of a transfusion reaction. If you or a relative donates blood, this is often done in anticipation of surgery and is not appropriate for emergency situations. It takes many days to process the donated blood. RISKS AND COMPLICATIONS Although transfusion therapy is very safe and saves many lives, the main dangers of transfusion include:  Getting an infectious disease. Developing a transfusion reaction. This is an allergic reaction to something in the blood you were given. Every precaution is taken to prevent this. The decision to have a blood transfusion has been considered carefully by your caregiver before blood is given. Blood is not given unless the benefits outweigh the risks.   AFTER SURGERY INSTRUCTIONS   Return to work: 4-6 weeks if applicable   Activity: 1. Be up and out of the bed during the day.  Take a nap if needed.  You may walk up  steps but be careful and use the hand rail.  Stair climbing will tire you more than you think, you may need to stop part way and rest.    2. No lifting or straining for 6 weeks over 10 pounds. No pushing, pulling, straining for 6 weeks.   3. No driving for around 1 week(s).  Do not drive if you are taking narcotic pain medicine and make sure that your reaction time has returned.    4. You can shower as soon as the next day after surgery. Shower daily.  Use your regular soap and water (not directly on the incision) and pat your incision(s) dry afterwards; don't rub.  No tub baths or submerging your body in water until cleared by your surgeon. If you have the soap that was given to you by pre-surgical testing that was used before surgery, you do not need to use it afterwards because this can irritate your incisions.    5. No sexual activity and nothing in the vagina for 12 weeks.   6. You may experience a small amount of clear drainage from your incisions, which is normal.  If the drainage persists, increases, or changes color please call the office.   7. Do not use creams, lotions, or ointments such as neosporin on your incisions after surgery until advised by your surgeon because they can cause removal of the dermabond glue on your incisions.     8. You may experience vaginal spotting after surgery or around the 6-8 week mark from surgery when the stitches at the top of the vagina begin to dissolve.  The spotting is normal but if you experience heavy bleeding, call our office.   9. Take Tylenol or ibuprofen (OR NAPROXEN (ALEVE), DO NOT TAKE TOGETHER) first for pain if you are able to take these medications and only use narcotic pain medication for severe pain not relieved by the Tylenol or Ibuprofen.  Monitor your Tylenol intake to a max of 4,000 mg in a 24 hour period. You can alternate these medications after surgery.   Diet: 1. Low sodium Heart Healthy Diet is recommended but you are cleared to  resume your normal (before surgery) diet after your procedure.   2. It is safe to use a laxative, such as Miralax or Colace, if you have  difficulty moving your bowels. You have been prescribed Sennakot-S to take at bedtime every evening after surgery to keep bowel movements regular and to prevent constipation.     Wound Care: 1. Keep clean and dry.  Shower daily.   Reasons to call the Doctor: Fever - Oral temperature greater than 100.4 degrees Fahrenheit Foul-smelling vaginal discharge Difficulty urinating Nausea and vomiting Increased pain at the site of the incision that is unrelieved with pain medicine. Difficulty breathing with or without chest pain New calf pain especially if only on one side Sudden, continuing increased vaginal bleeding with or without clots.   Contacts: For questions or concerns you should contact:   Dr. [name] at [place], NP at [phone]   After Hours: call [phone] and have the GYN Oncologist paged/contacted (after 5 pm or on the weekends).   Messages sent via mychart are for non-urgent matters and are not responded to after hours so for urgent needs, please call the after hours number.

## 2023-01-26 NOTE — Patient Instructions (Addendum)
Plan to have a CT scan prior to surgery and you will be notified with the results.  Preparing for your Surgery  Plan for surgery on February 24, 2023 with Dr. [name] at [facility] will be scheduled for robotic assisted total laparoscopic hysterectomy (removal of the uterus and cervix), bilateral salpingo-oophorectomy (removal of both ovaries and fallopian tubes), sentinel lymph node biopsy, possible lymph node dissection, possible laparotomy (larger incision on your abdomen if needed).   Pre-operative Testing -You will receive a phone call from presurgical testing at [facility] to arrange for a pre-operative appointment and lab work.  -Bring your insurance card, copy of an advanced directive if applicable, medication list  -At that visit, you will be asked to sign a consent for a possible blood transfusion in case a transfusion becomes necessary during surgery.  The need for a blood transfusion is rare but having consent is a necessary part of your care.     -You should not be taking blood thinners or aspirin at least ten days prior to surgery unless instructed by your surgeon.  -Do not take supplements such as fish oil (omega 3), red yeast rice, turmeric before your surgery. You want to avoid medications with aspirin in them including headache powders such as BC or Goody's), Excedrin migraine.  Day Before Surgery at [facility] will be asked to take in a light diet the day before surgery. You will be advised you can have clear liquids up until 3 hours before your surgery.    Eat a light diet the day before surgery.  Examples including soups, broths, toast, yogurt, mashed potatoes.  AVOID GAS PRODUCING FOODS AND BEVERAGES. Things to avoid include carbonated beverages (fizzy beverages, sodas), raw fruits and raw vegetables (uncooked), or beans.   If your bowels are filled with gas, your surgeon will have difficulty visualizing your pelvic organs which increases  your surgical risks.  Your role in recovery Your role is to become active as soon as directed by your doctor, while still giving yourself time to heal.  Rest when you feel tired. You will be asked to do the following in order to speed your recovery:  - Cough and breathe deeply. This helps to clear and expand your lungs and can prevent pneumonia after surgery.  - [place]. Do mild physical activity. Walking or moving your legs help your circulation and body functions return to normal. Do not try to get up or walk alone the first time after surgery.   -If you develop swelling on one leg or the other, pain in the back of your leg, redness/warmth in one of your legs, please call the office or go to the Emergency Room to have a doppler to rule out a blood clot. For shortness of breath, chest pain-seek care in the Emergency Room as soon as possible. - Actively manage your pain. Managing your pain lets you move in comfort. We will ask you to rate your pain on a scale of zero to 10. It is your responsibility to tell your doctor or nurse where and how much you hurt so your pain can be treated.  Special Considerations -If you are diabetic, you may be placed on insulin after surgery to have closer control over your blood sugars to promote healing and recovery.  This does not mean that you will be discharged on insulin.  If applicable, your oral antidiabetics will be resumed when you are tolerating a solid  diet.  -Your final pathology results from surgery should be available around one week after surgery and the results will be relayed to you when available.  -Dr. Lahoma Crocker is the surgeon that assists your GYN Oncologist with surgery.  If you end up staying the night, the next day after your surgery you will either see Dr. Berline Lopes, Dr. Ernestina Patches, or Dr. Lahoma Crocker.  -FMLA forms can be faxed to [phone] and please allow 5-7 business days for completion.  Pain Management  After Surgery -You have been prescribed your pain medication and bowel regimen medications before surgery so that you can have these available when you are discharged from the hospital. The pain medication is for use ONLY AFTER surgery and a new prescription will not be given.   -Make sure that you have Tylenol and Ibuprofen (OR ALEVE, DO NOT TAKE TOGETHER) IF YOU ARE ABLE TO TAKE THESE MEDICATIONS at home to use on a regular basis after surgery for pain control. We recommend alternating the medications every hour to six hours since they work differently and are processed in the body differently for pain relief.  -Review the attached handout on narcotic use and their risks and side effects.   Bowel Regimen -You have been prescribed Sennakot-S to take nightly to prevent constipation especially if you are taking the narcotic pain medication intermittently.  It is important to prevent constipation and drink adequate amounts of liquids. You can stop taking this medication when you are not taking pain medication and you are back on your normal bowel routine.  Risks of Surgery Risks of surgery are low but include bleeding, infection, damage to surrounding structures, re-operation, blood clots, and very rarely death.   Blood Transfusion Information (For the consent to be signed before surgery)  We will be checking your blood type before surgery so in case of emergencies, we will know what type of blood you would need.                                            WHAT IS A BLOOD TRANSFUSION?  A transfusion is the replacement of blood or some of its parts. Blood is made up of multiple cells which provide different functions. Red blood cells carry oxygen and are used for blood loss replacement. White blood cells fight against infection. Platelets control bleeding. Plasma helps clot blood. Other blood products are available for specialized needs, such as hemophilia or other clotting disorders. BEFORE  THE TRANSFUSION  Who gives blood for transfusions?  You may be able to donate blood to be used at a later date on yourself (autologous donation). Relatives can be asked to donate blood. This is generally not any safer than if you have received blood from a stranger. The same precautions are taken to ensure safety when a relative's blood is donated. Healthy volunteers who are fully evaluated to make sure their blood is safe. This is blood bank blood. Transfusion therapy is the safest it has ever been in the practice of medicine. Before blood is taken from a donor, a complete history is taken to make sure that person has no history of diseases nor engages in risky social behavior (examples are intravenous drug use or sexual activity with multiple partners). The donor's travel history is screened to minimize risk of transmitting infections, such as malaria. The donated blood is tested for signs of  infectious diseases, such as HIV and hepatitis. The blood is then tested to be sure it is compatible with you in order to minimize the chance of a transfusion reaction. If you or a relative donates blood, this is often done in anticipation of surgery and is not appropriate for emergency situations. It takes many days to process the donated blood. RISKS AND COMPLICATIONS Although transfusion therapy is very safe and saves many lives, the main dangers of transfusion include:  Getting an infectious disease. Developing a transfusion reaction. This is an allergic reaction to something in the blood you were given. Every precaution is taken to prevent this. The decision to have a blood transfusion has been considered carefully by your caregiver before blood is given. Blood is not given unless the benefits outweigh the risks.  AFTER SURGERY INSTRUCTIONS  Return to work: 4-6 weeks if applicable  Activity: 1. Be up and out of the bed during the day.  Take a nap if needed.  You may walk up steps but be careful and use  the hand rail.  Stair climbing will tire you more than you think, you may need to stop part way and rest.   2. No lifting or straining for 6 weeks over 10 pounds. No pushing, pulling, straining for 6 weeks.  3. No driving for around 1 week(s).  Do not drive if you are taking narcotic pain medicine and make sure that your reaction time has returned.   4. You can shower as soon as the next day after surgery. Shower daily.  Use your regular soap and water (not directly on the incision) and pat your incision(s) dry afterwards; don't rub.  No tub baths or submerging your body in water until cleared by your surgeon. If you have the soap that was given to you by pre-surgical testing that was used before surgery, you do not need to use it afterwards because this can irritate your incisions.   5. No sexual activity and nothing in the vagina for 12 weeks.  6. You may experience a small amount of clear drainage from your incisions, which is normal.  If the drainage persists, increases, or changes color please call the office.  7. Do not use creams, lotions, or ointments such as neosporin on your incisions after surgery until advised by your surgeon because they can cause removal of the dermabond glue on your incisions.    8. You may experience vaginal spotting after surgery or around the 6-8 week mark from surgery when the stitches at the top of the vagina begin to dissolve.  The spotting is normal but if you experience heavy bleeding, call our office.  9. Take Tylenol or ibuprofen (OR NAPROXEN (ALEVE), DO NOT TAKE TOGETHER) first for pain if you are able to take these medications and only use narcotic pain medication for severe pain not relieved by the Tylenol or Ibuprofen.  Monitor your Tylenol intake to a max of 4,000 mg in a 24 hour period. You can alternate these medications after surgery.  Diet: 1. Low sodium Heart Healthy Diet is recommended but you are cleared to resume your normal (before surgery)  diet after your procedure.  2. It is safe to use a laxative, such as Miralax or Colace, if you have difficulty moving your bowels. You have been prescribed Sennakot-S to take at bedtime every evening after surgery to keep bowel movements regular and to prevent constipation.    Wound Care: 1. Keep clean and dry.  Shower daily.  Reasons to call the Doctor: Fever - Oral temperature greater than 100.4 degrees Fahrenheit Foul-smelling vaginal discharge Difficulty urinating Nausea and vomiting Increased pain at the site of the incision that is unrelieved with pain medicine. Difficulty breathing with or without chest pain New calf pain especially if only on one side Sudden, continuing increased vaginal bleeding with or without clots.   Contacts: For questions or concerns you should contact:  Dr. [name] at [facility], NP at [phone]  After Hours: call [phone] and have the GYN Oncologist paged/contacted (after 5 pm or on the weekends).  Messages sent via mychart are for non-urgent matters and are not responded to after hours so for urgent needs, please call the after hours number.

## 2023-01-26 NOTE — Progress Notes (Signed)
Patient here for new patient consultation with Dr. Ernestina Patches and for a pre-operative appointment prior to her scheduled surgery on February 24, 2023. She is scheduled for robotic assisted total laparoscopic hysterectomy, bilateral salpingo-oophorectomy, sentinel lymph node biopsy, possible lymph node dissection, possible laparotomy. The surgery was discussed in detail.  See after visit summary for additional details.    Discussed post-op pain management in detail including the aspects of the enhanced recovery pathway.  Advised her that a new prescription would be sent in for tramadol and it is only to be used for after her upcoming surgery.  She did not want a medication that has HAs as a side effect. Advised almost all pain medications have a HA as potential side effect. We discussed the use of tylenol post-op and to monitor for a maximum of 4,000 mg in a 24 hour period.  Also prescribed sennakot to be used after surgery and to hold if having loose stools.  Discussed bowel regimen in detail.     Discussed the use of SCDs and measures to take at home to prevent DVT including frequent mobility.  Reportable signs and symptoms of DVT discussed. Post-operative instructions discussed and expectations for after surgery. Incisional care discussed as well including reportable signs and symptoms including erythema, drainage, wound separation.     10 minutes spent with the patient and preparing information.  Verbalizing understanding of material discussed. No needs or concerns voiced at the end of the visit.   Advised patient to call for any needs.  Advised that her post-operative medications had been prescribed and could be picked up at any time.    This appointment is included in the global surgical bundle as pre-operative teaching and has no charge.

## 2023-01-26 NOTE — Progress Notes (Signed)
GYNECOLOGIC ONCOLOGY NEW PATIENT CONSULTATION  Date of Service: 01/26/2023 Referring Provider: A, Wilhelmson Julie, NP [address] [place],  [place] [address]   ASSESSMENT AND PLAN: Becky Sanchez is a 57 y.o. woman with FIGO grade 1 endometrial cancer.  We reviewed the nature of endometrial cancer and its recommended surgical staging, including total hysterectomy, bilateral salpingo-oophorectomy, and lymph node assessment. The patient is a suitable candidate for staging via a minimally invasive approach to surgery.  We reviewed that robotic assistance would be used to complete the surgery.   We discussed that most endometrial cancer is detected early and that decisions regarding adjuvant therapy will be made based on her final pathology.   Given her symptoms of early satiety and weight loss as well as her postmenopausal bleeding for over 2 years, recommend CT abdomen/pelvis prior to surgery.  Patient aware that pending results of CT scan, we may or may not proceed with surgery and instead could require neoadjuvant treatment if distant metastatic disease.  We reviewed the sentinel lymph node technique. Risks and benefits of sentinel lymph node biopsy was reviewed. We reviewed the technique and ICG dye. The patient DOES NOT have an iodine allergy or known liver dysfunction. We reviewed the false negative rate (0.4%), and that 3% of patients with metastatic disease will not have it detected by SLN biopsy in endometrial cancer. A low risk of allergic reaction to the dye, <0.2% for ICG, has been reported. We also discussed that in the case of failed mapping, which occurs 40% of the time, a bilateral or unilateral lymphadenectomy will be performed at the surgeon's discretion.   Potential benefits of sentinel nodes including a higher detection rate for metastasis due to ultrastaging and potential reduction in operative morbidity. However, there remains uncertainty as to the role for treatment of micrometastatic  disease. Further, the benefit of operative morbidity associated with the SLN technique in endometrial cancer is not yet completely known. In other patient populations (e.g. the cervical cancer population) there has been observed reductions in morbidity with SLN biopsy compared to pelvic lymphadenectomy. Lymphedema, nerve dysfunction and lymphocysts are all potential risks with the SLN technique as with complete lymphadenectomy. Additional risks to the patient include the risk of damage to an internal organ while operating in an altered view (e.g. the black and white image of the robotic fluorescence imaging mode).   Patient was consented for: Robotic assisted total laparoscopic hysterectomy, bilateral salpingo-oophorectomy, sentinel lymph node evaluation and biopsy, possible lymph node dissection, possible laparotomy on 02/24/2023.  The risks of surgery were discussed in detail and she understands these to including but not limited to bleeding requiring a blood transfusion, infection, injury to adjacent organs (including but not limited to the bowels, bladder, ureters, nerves, blood vessels), thromboembolic events, wound separation, hernia, vaginal cuff separation, possible risk of lymphedema and lymphocyst if lymphadenectomy performed, unforseen complication, and possible need for re-exploration.  If the patient experiences any of these events, she understands that her hospitalization or recovery may be prolonged and that she may need to take additional medications for a prolonged period. The patient will receive DVT and antibiotic prophylaxis as indicated. She voiced a clear understanding. She had the opportunity to ask questions and written informed consent was obtained today. She wishes to proceed.  She will proceed to the lab today for urinalysis given urinary frequency. Also reviewed tobacco cessation.  Prescription for nicotine patch and lozenges prescribed. Also, unclear when patient's last Pap  smear was.  No Pap   smear is listed in chart.  Pap smear collected today. She does not require preoperative clearance. Her METs are >4.  All preoperative instructions were reviewed. Postoperative expectations were also reviewed.    A copy of this note was sent to the patient's referring provider.  Oria Klimas, MD Gynecologic Oncology   Medical Decision Making I personally spent  TOTAL 55 minutes face-to-face and non-face-to-face in the care of this patient, which includes all pre, intra, and post visit time on the date of service.  ------------  CC: Endometrial cancer  HISTORY OF PRESENT ILLNESS:  Becky Sanchez is a 57 y.o. woman who is seen in consultation at the request of A, Wilhelmson Julie, NP for evaluation of a new diagnosis of endometrial cancer.  Patient underwent an ultrasound on 09/10/2021 for 3 months of dysfunctional uterine bleeding and was noted at that time to a thickened endometrium was 20 mm.  The patient believes that the biopsy of some sort may have been collected at that time, however, no results are available to confirm this.  She has since presented to her OB/GYN for 2 years of postmenopausal bleeding. Patient underwent a biopsy in 01/05/2023 which returned with FIGO stage I endometrioid adenocarcinoma.  Today, patient reports light ongoing vaginal bleeding.  She also notes pelvic pain that she describes as feeling like a knot since her biopsy.  During that same time she notes increased urinary frequency but denies urinary urgency, dysuria, hematuria.  She endorses early satiety for several years and reports about a 10 pound weight loss over the past year without any coinciding lifestyle changes.  She otherwise denies abdominal bloating or change in bowel habits.  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  PAST MEDICAL HISTORY: Past Medical History:  Diagnosis Date   Arthritis    Depression    Fractures    Migraine     PAST  SURGICAL HISTORY: Past Surgical History:  Procedure Laterality Date   TUBAL LIGATION     WRIST SURGERY Right     OB/GYN HISTORY: OB History  Gravida Para Term Preterm AB Living  2 2 2     2  SAB IAB Ectopic Multiple Live Births          2    # Outcome Date GA Lbr Len/2nd Weight Sex Delivery Anes PTL Lv  2 Term      Vag-Spont   LIV  1 Term      Vag-Spont   LIV      Age at menarche: 11 or 12 Age at menopause: Unknown due to bleeding Hx of HRT: None Hx of STI: gonorrhea many years ago Last pap: unsure History of abnormal pap smears: none that she knows of  SCREENING STUDIES:  Last mammogram: 09/2010 Last colonoscopy: none  MEDICATIONS:  Current Outpatient Medications:    naproxen sodium (ALEVE) 220 MG tablet, Take 220 mg by mouth., Disp: , Rfl:    OVER THE COUNTER MEDICATION, Take by mouth daily. Move Free Joint health Advance, Disp: , Rfl:   ALLERGIES: Allergies  Allergen Reactions   Percocet [Oxycodone-Acetaminophen] Hives    FAMILY HISTORY: Family History  Problem Relation Age of Onset   Thyroid disease Mother    Heart disease Maternal Grandmother    Alzheimer's disease Maternal Grandmother    Dementia Maternal Grandfather    Prostate cancer Paternal Grandfather    Breast cancer Neg Hx    Ovarian cancer Neg Hx    Endometrial cancer   Neg Hx    Colon cancer Neg Hx     SOCIAL HISTORY: Social History   Socioeconomic History   Marital status: Divorced    Spouse name: Not on file   Number of children: Not on file   Years of education: Not on file   Highest education level: Not on file  Occupational History   Not on file  Tobacco Use   Smoking status: Every Day    Packs/day: 1.00    Years: 30.00    Total pack years: 30.00    Types: Cigarettes   Smokeless tobacco: Never  Substance and Sexual Activity   Alcohol use: Yes    Comment: Occ   Drug use: Never   Sexual activity: Yes  Other Topics Concern   Not on file  Social History Narrative   Not on  file   Social Determinants of Health   Financial Resource Strain: Not on file  Food Insecurity: Not on file  Transportation Needs: Not on file  Physical Activity: Not on file  Stress: Not on file  Social Connections: Not on file  Intimate Partner Violence: Not on file    REVIEW OF SYSTEMS: New patient intake form was reviewed.  Complete 10-system review is negative except for the following: Pelvic pain  PHYSICAL EXAM: BP 138/85 (BP Location: Left Arm, Patient Position: Sitting)   Pulse 84   Temp 98.5 F (36.9 C) (Oral)   Resp 14   Ht 5' 2.21" (1.58 m)   Wt 163 lb (73.9 kg)   SpO2 100%   BMI 29.62 kg/m  Constitutional: No acute distress. Neuro/Psych: Alert, oriented.  Head and Neck: Normocephalic, atraumatic. Neck symmetric without masses. Sclera anicteric.  Respiratory: Normal work of breathing. Clear to auscultation bilaterally. Cardiovascular: Regular rate and rhythm, no murmurs, rubs, or gallops. Abdomen: Normoactive bowel sounds. Soft, non-distended, non-tender to palpation. No masses or hepatosplenomegaly appreciated. No evidence of hernia. No palpable fluid wave.  Extremities: Grossly normal range of motion. Warm, well perfused. No edema bilaterally. Skin: No rashes or lesions. Lymphatic: No cervical, supraclavicular, or inguinal adenopathy. Genitourinary:External genitalia without lesions. Urethral meatus without lesions or prolapse. On speculum exam, vagina and cervix without lesions.  Pap smear collected bimanual exam reveals anteverted mildly enlarged mobile uterus.  No adnexal masses or tenderness. Rectovaginal exam confirms the above findings and reveals normal sphincter tone and no masses or nodularity. Exam chaperoned by Melissa Cross, NP   LABORATORY AND RADIOLOGIC DATA: Outside medical records were reviewed to synthesize the above history, along with the history and physical obtained during the visit.  Outside laboratory, pathology, and imaging reports were  reviewed, with pertinent results below.    No results found for: "WBC", "HGB", "HCT", "PLT", "LDH", "MG", "CREATININE", "AST", "ALT", "CAN125", "CA125", "CEA", "AFPTM", "CA199", "HCGTM", "HPV"  Surgical pathology (01/05/23): Diagnosis   Specimen 1: Endometrium, biopsy -  ENDOMETRIOID ADENOCARCINOMA, FIGO GRADE 1, WITHIN  A BACKGROUND OF ATYPICAL HYPERPLASIA.  SEE COMMENT   Pelvic ultrasound (09/09/21), outside: EXAM:  TRANSABDOMINAL AND TRANSVAGINAL ULTRASOUND OF PELVIS   TECHNIQUE:  Both transabdominal and transvaginal ultrasound examinations of the  pelvis were performed. Transabdominal technique was performed for  global imaging of the pelvis including uterus, ovaries, adnexal  regions, and pelvic cul-de-sac. It was necessary to proceed with  endovaginal exam following the transabdominal exam to visualize the  endometrium and ovaries.   COMPARISON:  None   FINDINGS:  Uterus   Measurements: 8.2 x 5.0 x 6.7 cm = volume: 146 mL.   0.8 cm hypoechoic  structure in the lower uterine segment is favored to be a small  fibroid   Endometrium   Thickness: 20 mm.  No focal abnormality visualized.   Right ovary   Measurements: 2.3 x 1.7 x 1.6 cm = volume: 3.2 mL. Normal  appearance/no adnexal mass.   Left ovary   Measurements: 1.8 x 1.5 x 1.8 cm = volume: 2.5 mL. Normal  appearance/no adnexal mass.   Other findings   No abnormal free fluid.   IMPRESSION:  Thickened endometrium measuring up to 20 mm, likely related to phase  of menstruation, given patient's premenopausal status. Follow-up  ultrasound should be performed in 8-12 weeks to document normal  endometrial thickness.  

## 2023-01-27 ENCOUNTER — Other Ambulatory Visit: Payer: Self-pay | Admitting: Gynecologic Oncology

## 2023-01-27 DIAGNOSIS — C541 Malignant neoplasm of endometrium: Secondary | ICD-10-CM

## 2023-02-02 ENCOUNTER — Encounter (HOSPITAL_COMMUNITY): Payer: Self-pay

## 2023-02-02 ENCOUNTER — Telehealth: Payer: Self-pay | Admitting: Surgery

## 2023-02-02 ENCOUNTER — Ambulatory Visit (HOSPITAL_COMMUNITY)
Admission: RE | Admit: 2023-02-02 | Discharge: 2023-02-02 | Disposition: A | Discharge status: Home / Self Care | Payer: BC Managed Care – PPO | Source: Ambulatory Visit | Attending: Psychiatry | Admitting: Psychiatry

## 2023-02-02 DIAGNOSIS — C541 Malignant neoplasm of endometrium: Secondary | ICD-10-CM | POA: Diagnosis present

## 2023-02-02 MED ORDER — IOHEXOL 9 MG/ML PO SOLN
ORAL | Status: AC
  Filled 2023-02-02: qty 1000

## 2023-02-02 MED ORDER — IOHEXOL 300 MG/ML  SOLN
100.0000 mL | Freq: Once | INTRAMUSCULAR | Status: AC | PRN
  Administered 2023-02-02: 100 mL via INTRAVENOUS

## 2023-02-02 MED ORDER — IOHEXOL 9 MG/ML PO SOLN
1000.0000 mL | ORAL | Status: AC
  Administered 2023-02-02: 1000 mL via ORAL

## 2023-02-02 NOTE — Telephone Encounter (Signed)
Called patient to check on urinary status. Per Dr Ernestina Patches, CT today showed some bladder thickening. Assessed urinary symptoms. Patient states her only symptom is urinary frequency. She denies urgency, hematuria, and incontinence. 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 Denies any fevers or chills or burning with urination. Patient had no other questions or concerns at this time. Advised that our clinic will call her back if there are any additional recommendations from Dr Ernestina Patches.

## 2023-02-03 LAB — CYTOLOGY - PAP
Comment: NEGATIVE
Comment: NEGATIVE
Comment: NEGATIVE
Diagnosis: HIGH — AB
HPV 16: NEGATIVE
HPV 18 / 45: POSITIVE — AB
High risk HPV: POSITIVE — AB

## 2023-02-04 ENCOUNTER — Telehealth: Payer: Self-pay

## 2023-02-04 NOTE — Telephone Encounter (Signed)
Per Dr. Ernestina Patches  Pt has endometrial cancer with surgery scheduled for 2/27.   Her pap came back HPV HR+, ASC-H, so she needs to come in for a colpo and biopsies. Can she come on Monday?   Pt is scheduled for Pre-admission on 2/12 (moved from 2/14 per pt request d/t transportation issues) And follow up for Colpo with [place] on 2/12 @ 2:30.  Pt agreed to date and time.

## 2023-02-04 NOTE — Telephone Encounter (Signed)
[place] Leave of Absence form faxed/confirmed to [phone]

## 2023-02-05 ENCOUNTER — Inpatient Hospital Stay: Payer: BC Managed Care – PPO | Attending: Psychiatry | Admitting: Licensed Clinical Social Worker

## 2023-02-05 DIAGNOSIS — C541 Malignant neoplasm of endometrium: Secondary | ICD-10-CM | POA: Insufficient documentation

## 2023-02-05 DIAGNOSIS — R87611 Atypical squamous cells cannot exclude high grade squamous intraepithelial lesion on cytologic smear of cervix (ASC-H): Secondary | ICD-10-CM | POA: Insufficient documentation

## 2023-02-05 NOTE — Progress Notes (Signed)
[place] [facility]  Initial Assessment   SAI MOURA is a 57 y.o. year old female contacted by phone. Clinical Social Work was referred by new patient protocol for assessment of psychosocial needs.   SDOH (Social Determinants of Health) assessments performed: Yes SDOH Interventions    Flowsheet Row Clinical Support from 02/05/2023 in [place] at [facility]  SDOH Interventions   Transportation Interventions Patient Resources (Friends/Family)       SDOH Screenings   Transportation Needs: Unmet Transportation Needs (02/05/2023)  Tobacco Use: High Risk (02/02/2023)     Distress Screen completed: No     No data to display            Family/Social Information:  Housing Arrangement: Pt has significant other Family members/support persons in your life? Friends Transportation concerns: yes, relies on friends to drive her and has to come here from [employer] concerns: No Type of concern: None Food access concerns: no Religious or spiritual practice: Not known Services Currently in place:    Coping/ Adjustment to diagnosis: Patient understands treatment plan and what happens next? yes Concerns about diagnosis and/or treatment:  getting to and from [facility] Patient reported stressors: Transportation Current coping skills/ strengths: Supportive family/friends     SUMMARY: Current SDOH Barriers:  Transportation- relies on friends. Per pt, does not have Medicaid/ no insurance benefit for transportation  Clinical Social Work Clinical Goal(s):  No clinical social work goals at this time  Interventions: Discussed common feeling and emotions when being diagnosed with cancer, and the importance of support during treatment Informed patient of the support team roles and support services at [facility] Provided [place] contact information and encouraged patient to call with any questions or concerns   Follow Up Plan: Patient will contact CSW with any  support or resource needs Patient verbalizes understanding of plan: Yes    Kariana Wiles E Kaeden Depaz, LCSW

## 2023-02-06 NOTE — Patient Instructions (Signed)
SURGICAL WAITING ROOM VISITATION Patients having surgery or a procedure may have no more than 2 support people in the waiting area - these visitors may rotate.    If the patient needs to stay at the hospital during part of their recovery, the visitor guidelines for inpatient rooms apply. Pre-op nurse will coordinate an appropriate time for 1 support person to accompany patient in pre-op.  This support person may not rotate.    Please refer to the [facility] website for the visitor guidelines for Inpatients (after your surgery is over and you are in a regular room).   Due to an increase in RSV and influenza rates and associated hospitalizations, children ages 42 and under may not visit patients in [place].     Your procedure is scheduled on: 02-24-23   Report to [facility] Main Entrance    Report to admitting at 7:45 AM   Call this number if you have problems the morning of surgery [phone]   Follow a light diet the day before surgery (avoid gas producing foods)   Do not eat food :After Midnight.   After Midnight you may have the following liquids until 7:00 AM DAY OF SURGERY  Water Non-Citrus Juices (without pulp, NO RED) Carbonated Beverages Black Coffee (NO MILK/CREAM OR CREAMERS, sugar ok)  Clear Tea (NO MILK/CREAM OR CREAMERS, sugar ok) regular and decaf                             Plain Jell-O (NO RED)                                           Fruit ices (not with fruit pulp, NO RED)                                     Popsicles (NO RED)                                                               Sports drinks like Gatorade (NO RED)                       If you have questions, please contact your surgeon's office.   FOLLOW ANY ADDITIONAL PRE OP INSTRUCTIONS YOU RECEIVED FROM YOUR SURGEON'S OFFICE!!!     Oral Hygiene is also important to reduce your risk of infection.                                    Remember - BRUSH YOUR TEETH THE MORNING  OF SURGERY WITH YOUR REGULAR TOOTHPASTE   Do NOT smoke after Midnight   Take these medicines the morning of surgery with A SIP OF WATER:  None                              You may not have any metal on your body including hair pins,  jewelry, and body piercing             Do not wear make-up, lotions, powders, perfumes or deodorant  Do not wear nail polish including gel and S&S, artificial/acrylic nails, or any other type of covering on natural nails including finger and toenails. If you have artificial nails, gel coating, etc. that needs to be removed by a nail salon please have this removed prior to surgery or surgery may need to be canceled/ delayed if the surgeon/ anesthesia feels like they are unable to be safely monitored.   Do not shave  48 hours prior to surgery.         Do not bring valuables to the hospital. [place].   Contacts, dentures or bridgework may not be worn into surgery.   Bring small overnight bag day of surgery.   DO NOT [place]. PHARMACY WILL DISPENSE MEDICATIONS LISTED ON YOUR MEDICATION LIST TO YOU DURING YOUR ADMISSION [place]!    Patients discharged on the day of surgery will not be allowed to drive home.  Someone NEEDS to stay with you for the first 24 hours after anesthesia.              Please read over the following fact sheets you were given: IF [place]  If you received a COVID test during your pre-op visit  it is requested that you wear a mask when out in public, stay away from anyone that may not be feeling well and notify your surgeon if you develop symptoms. If you test positive for Covid or have been in contact with anyone that has tested positive in the last 10 days please notify you surgeon.  [place] - Preparing for Surgery Before surgery, you can play an important role.  Because skin is not  sterile, your skin needs to be as free of germs as possible.  You can reduce the number of germs on your skin by washing with CHG (chlorahexidine gluconate) soap before surgery.  CHG is an antiseptic cleaner which kills germs and bonds with the skin to continue killing germs even after washing. Please DO NOT use if you have an allergy to CHG or antibacterial soaps.  If your skin becomes reddened/irritated stop using the CHG and inform your nurse when you arrive at Short Stay. Do not shave (including legs and underarms) for at least 48 hours prior to the first CHG shower.  You may shave your face/neck.  Please follow these instructions carefully:  1.  Shower with CHG Soap the night before surgery and the  morning of surgery.  2.  If you choose to wash your hair, wash your hair first as usual with your normal  shampoo.  3.  After you shampoo, rinse your hair and body thoroughly to remove the shampoo.                             4.  Use CHG as you would any other liquid soap.  You can apply chg directly to the skin and wash.  Gently with a scrungie or clean washcloth.  5.  Apply the CHG Soap to your body ONLY FROM THE NECK DOWN.   Do   not use on face/ open  Wound or open sores. Avoid contact with eyes, ears mouth and   genitals (private parts).                       Wash face,  Genitals (private parts) with your normal soap.             6.  Wash thoroughly, paying special attention to the area where your    surgery  will be performed.  7.  Thoroughly rinse your body with warm water from the neck down.  8.  DO NOT shower/wash with your normal soap after using and rinsing off the CHG Soap.                9.  Pat yourself dry with a clean towel.            10.  Wear clean pajamas.            11.  Place clean sheets on your bed the night of your first shower and do not  sleep with pets. Day of Surgery : Do not apply any lotions/deodorants the morning of surgery.  Please wear  clean clothes to the hospital/surgery center.  FAILURE TO FOLLOW THESE INSTRUCTIONS MAY RESULT IN THE CANCELLATION OF YOUR SURGERY  PATIENT SIGNATURE_________________________________  NURSE SIGNATURE__________________________________  ________________________________________________________________________   WHAT IS A BLOOD TRANSFUSION? Blood Transfusion Information  A transfusion is the replacement of blood or some of its parts. Blood is made up of multiple cells which provide different functions. Red blood cells carry oxygen and are used for blood loss replacement. White blood cells fight against infection. Platelets control bleeding. Plasma helps clot blood. Other blood products are available for specialized needs, such as hemophilia or other clotting disorders. BEFORE THE TRANSFUSION  Who gives blood for transfusions?  Healthy volunteers who are fully evaluated to make sure their blood is safe. This is blood bank blood. Transfusion therapy is the safest it has ever been in the practice of medicine. Before blood is taken from a donor, a complete history is taken to make sure that person has no history of diseases nor engages in risky social behavior (examples are intravenous drug use or sexual activity with multiple partners). The donor's travel history is screened to minimize risk of transmitting infections, such as malaria. The donated blood is tested for signs of infectious diseases, such as HIV and hepatitis. The blood is then tested to be sure it is compatible with you in order to minimize the chance of a transfusion reaction. If you or a relative donates blood, this is often done in anticipation of surgery and is not appropriate for emergency situations. It takes many days to process the donated blood. RISKS AND COMPLICATIONS Although transfusion therapy is very safe and saves many lives, the main dangers of transfusion include:  Getting an infectious disease. Developing a  transfusion reaction. This is an allergic reaction to something in the blood you were given. Every precaution is taken to prevent this. The decision to have a blood transfusion has been considered carefully by your caregiver before blood is given. Blood is not given unless the benefits outweigh the risks. AFTER THE TRANSFUSION Right after receiving a blood transfusion, you will usually feel much better and more energetic. This is especially true if your red blood cells have gotten low (anemic). The transfusion raises the level of the red blood cells which carry oxygen, and this usually causes an energy increase. The nurse administering the  transfusion will monitor you carefully for complications. HOME CARE INSTRUCTIONS  No special instructions are needed after a transfusion. You may find your energy is better. Speak with your caregiver about any limitations on activity for underlying diseases you may have. SEEK MEDICAL CARE IF:  Your condition is not improving after your transfusion. You develop redness or irritation at the intravenous (IV) site. SEEK IMMEDIATE MEDICAL CARE IF:  Any of the following symptoms occur over the next 12 hours: Shaking chills. You have a temperature by mouth above 102 F (38.9 C), not controlled by medicine. Chest, back, or muscle pain. People around you feel you are not acting correctly or are confused. Shortness of breath or difficulty breathing. Dizziness and fainting. You get a rash or develop hives. You have a decrease in urine output. Your urine turns a dark color or changes to pink, red, or brown. Any of the following symptoms occur over the next 10 days: You have a temperature by mouth above 102 F (38.9 C), not controlled by medicine. Shortness of breath. Weakness after normal activity. The white part of the eye turns yellow (jaundice). You have a decrease in the amount of urine or are urinating less often. Your urine turns a dark color or changes to  pink, red, or brown. Document Released: 12/12/2000 Document Revised: 03/08/2012 Document Reviewed: 07/31/2008 [facility] Patient Information 2014 [place], [place].  _______________________________________________________________________

## 2023-02-09 ENCOUNTER — Other Ambulatory Visit: Payer: Self-pay

## 2023-02-09 ENCOUNTER — Encounter: Payer: Self-pay | Admitting: Psychiatry

## 2023-02-09 ENCOUNTER — Inpatient Hospital Stay (HOSPITAL_BASED_OUTPATIENT_CLINIC_OR_DEPARTMENT_OTHER): Payer: BC Managed Care – PPO | Admitting: Psychiatry

## 2023-02-09 ENCOUNTER — Encounter (HOSPITAL_COMMUNITY): Payer: Self-pay

## 2023-02-09 ENCOUNTER — Encounter (HOSPITAL_COMMUNITY)
Admission: RE | Admit: 2023-02-09 | Discharge: 2023-02-09 | Disposition: A | Discharge status: Home / Self Care | Payer: BC Managed Care – PPO | Source: Ambulatory Visit | Attending: Psychiatry | Admitting: Psychiatry

## 2023-02-09 VITALS — BP 121/71 | HR 72 | Temp 98.2°F | Resp 18 | Wt 161.0 lb

## 2023-02-09 DIAGNOSIS — R87611 Atypical squamous cells cannot exclude high grade squamous intraepithelial lesion on cytologic smear of cervix (ASC-H): Secondary | ICD-10-CM | POA: Diagnosis present

## 2023-02-09 DIAGNOSIS — C541 Malignant neoplasm of endometrium: Secondary | ICD-10-CM | POA: Insufficient documentation

## 2023-02-09 DIAGNOSIS — Z01812 Encounter for preprocedural laboratory examination: Secondary | ICD-10-CM | POA: Insufficient documentation

## 2023-02-09 HISTORY — DX: Pneumonia, unspecified organism: J18.9

## 2023-02-09 HISTORY — DX: Malignant neoplasm of endometrium: C54.1

## 2023-02-09 LAB — COMPREHENSIVE METABOLIC PANEL
ALT: 18 U/L (ref 0–44)
AST: 18 U/L (ref 15–41)
Albumin: 3.8 g/dL (ref 3.5–5.0)
Alkaline Phosphatase: 88 U/L (ref 38–126)
Anion gap: 7 (ref 5–15)
BUN: 11 mg/dL (ref 6–20)
CO2: 23 mmol/L (ref 22–32)
Calcium: 8.8 mg/dL — ABNORMAL LOW (ref 8.9–10.3)
Chloride: 110 mmol/L (ref 98–111)
Creatinine, Ser: 0.84 mg/dL (ref 0.44–1.00)
GFR, Estimated: >60 mL/min (ref >60)
Glucose, Bld: 106 mg/dL — ABNORMAL HIGH (ref 70–99)
Potassium: 4 mmol/L (ref 3.5–5.1)
Sodium: 140 mmol/L (ref 135–145)
Total Bilirubin: 0.7 mg/dL (ref 0.3–1.2)
Total Protein: 7.2 g/dL (ref 6.5–8.1)

## 2023-02-09 LAB — CBC
HCT: 39.8 % (ref 36.0–46.0)
Hemoglobin: 12.7 g/dL (ref 12.0–15.0)
MCH: 29.3 pg (ref 26.0–34.0)
MCHC: 31.9 g/dL (ref 30.0–36.0)
MCV: 91.7 fL (ref 80.0–100.0)
Platelets: 342 10*3/uL (ref 150–400)
RBC: 4.34 MIL/uL (ref 3.87–5.11)
RDW: 13.1 % (ref 11.5–15.5)
WBC: 10.9 10*3/uL — ABNORMAL HIGH (ref 4.0–10.5)
nRBC: 0 % (ref 0.0–0.2)

## 2023-02-09 NOTE — Progress Notes (Addendum)
COVID Vaccine Completed:  No  Date of COVID positive in last 90 days:  No  PCP - Dr. Judd Lien Cardiologist - N/A  Chest x-ray -  N/A EKG -  N/A Stress Test -  N/A ECHO -  N/A Cardiac Cath -  N/A Pacemaker/ICD device last checked: Spinal Cord Stimulator: N/A  Bowel Prep -  N/A  Sleep Study -  N/A CPAP -   Fasting Blood Sugar -  N/A Checks Blood Sugar _____ times a day  Last dose of GLP1 agonist-  N/A GLP1 instructions:  N/A   Last dose of SGLT-2 inhibitors-  N/A SGLT-2 instructions: N/A  Blood Thinner Instructions: Aspirin Instructions: Last Dose:  Activity level:  Can go up a flight of stairs and perform activities of daily living without stopping and without symptoms of chest pain or shortness of breath. Able to exercise without symptoms  Anesthesia review:  N/A  Patient denies shortness of breath, fever, cough and chest pain at PAT appointment  Patient verbalized understanding of instructions that were given to them at the PAT appointment. Patient was also instructed that they will need to review over the PAT instructions again at home before surgery.

## 2023-02-09 NOTE — Progress Notes (Signed)
Gynecologic Oncology Return Clinic Visit  Date of Service: 02/09/2023 Referring Provider: Judy Pimple, NP [address] [place],  [place] [address]  Assessment & Plan: Becky Sanchez is a 57 y.o. 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  Colpo performed. See procedure note below. Biopsy obtained at 1 o'clock and ECC. Discussed with patient that if based on these results, further evaluation of her cervix is indicated prior to surgery, this could change our timing for surgery. Reviewed that we would not want to miss a cervical cancer prior to surgery as the surgical approach would potentially change.   Otherwise, CT results reviewed with pt. No evidence of metastatic disaese. All questions answered. Will notify patient of results when available. Surgery still currently scheduled for 02/24/23.   RTC Postop.  [name], MD Gynecologic Oncology   Medical Decision Making I personally spent  TOTAL 25 minutes face-to-face and non-face-to-face in the care of this patient, which includes all pre, intra, and post visit time on the date of service.   ----------------------- Reason for Visit: Abnormal pap smear  Treatment History: Oncology History  Endometrial cancer ([place])  09/10/2021 Imaging   Pelvic u/s for AUB: EMS 22m   01/05/2023 Initial Biopsy   EMBx for 2 years of [id number] 1 endometrioid adenocarcinoma   01/05/2023 Initial Diagnosis   Endometrial cancer ([place]   02/02/2023 Imaging   CT A/P: IMPRESSION: 1. Heterogeneous thickening of the endometrium compatible with patient's given history of endometrial carcinoma. 2. No definite evidence of metastatic disease in the abdomen or pelvis. 3. Subcentimeter left adrenal nodule is nonspecific but favored to reflect a benign adrenal adenoma. Consider attention on follow-up imaging. 4. Mild bladder wall thickening with subtle  urothelial hyperenhancement, which may reflect cystitis. Correlate with urinalysis. 5. Diffuse hepatic steatosis. 6. Left-sided colonic diverticulosis without findings of acute diverticulitis.     Interval History: Pt underwent cervical cancer screening at her initial visit given that she was not up-to-date prior to hysterectomy. Her pap returned with ASC-H, HPV 18/45+. Given this, she was recommended to return for cervical colposcopy. No changes otherwise today.  Past Medical/Surgical History: Past Medical History:  Diagnosis Date   Arthritis    Depression    [place]    Fractures    Migraine    Pneumonia     Past Surgical History:  Procedure Laterality Date   TUBAL LIGATION     WRIST SURGERY Right     Family History  Problem Relation Age of Onset   Thyroid disease Mother    Heart disease Maternal Grandmother    Alzheimer's disease Maternal Grandmother    Dementia Maternal Grandfather    Prostate cancer Paternal Grandfather    Breast cancer Neg Hx    Ovarian cancer Neg Hx    Endometrial cancer Neg Hx    Colon cancer Neg Hx     Social History   Socioeconomic History   Marital status: Divorced    Spouse name: Not on file   Number of children: Not on file   Years of education: Not on file   Highest education level: Not on file  Occupational History   Not on file  Tobacco Use   Smoking status: Every Day    Packs/day: 0.25    Years: 30.00    Total pack years: 7.50    Types: Cigarettes   Smokeless tobacco: Never  Vaping Use  Vaping Use: Never used  Substance and Sexual Activity   Alcohol use: Yes    Comment: Rare   Drug use: Never   Sexual activity: Yes    Birth control/protection: Post-menopausal  Other Topics Concern   Not on file  Social History Narrative   Not on file   Social Determinants of Health   Financial Resource Strain: Not on file  Food Insecurity: Not on file  Transportation Needs: Unmet Transportation Needs  (02/05/2023)   PRAPARE - Hydrologist (Medical): Yes    Lack of Transportation (Non-Medical): No  Physical Activity: Not on file  Stress: Not on file  Social Connections: Not on file    Current Medications:  Current Outpatient Medications:    Glucos-Chond-Hyal Ac-Ca Fructo (MOVE FREE JOINT HEALTH ADVANCE PO), Take 1 tablet by mouth daily., Disp: , Rfl:    Multiple Vitamin (MULTIVITAMIN WITH MINERALS) TABS tablet, Take 1 tablet by mouth daily., Disp: , Rfl:    naproxen sodium (ALEVE) 220 MG tablet, Take 220 mg by mouth daily as needed (pain)., Disp: , Rfl:    nicotine (NICODERM CQ - DOSED IN MG/24 HOURS) 21 mg/24hr patch, Place 1 patch (21 mg total) onto the skin daily. (Patient not taking: Reported on 02/04/2023), Disp: 28 patch, Rfl: 3   nicotine polacrilex (NICOTINE MINI) 2 MG lozenge, Take 1 lozenge (2 mg total) by mouth as needed for smoking cessation. (Patient not taking: Reported on 02/04/2023), Disp: 48 tablet, Rfl: 3   senna-docusate (SENOKOT-S) 8.6-50 MG tablet, Take 2 tablets by mouth at bedtime. For AFTER surgery, do not take if having diarrhea, Disp: 30 tablet, Rfl: 0   traMADol (ULTRAM) 50 MG tablet, Take 1 tablet (50 mg total) by mouth every 6 (six) hours as needed for severe pain. For AFTER surgery only, do not take and drive, Disp: 10 tablet, Rfl: 0   trolamine salicylate (ASPERCREME) 10 % cream, Apply 1 Application topically as needed for muscle pain., Disp: , Rfl:   Review of Symptoms: Complete 10-system review is negative except as above in Interval History.  Physical Exam: BP 121/71 (BP Location: Left Arm, Patient Position: Sitting)   Pulse 72   Temp 98.2 F (36.8 C) (Oral)   Resp 18   Wt 161 lb (73 kg)   BMI 29.45 kg/m  General: Alert, oriented, no acute distress. HEENT: Normocephalic, atraumatic. Neck symmetric without masses. Sclera anicteric. Chest: Normal work of breathing.  Abdomen: Soft, nontender.  Extremities: Grossly normal  range of motion.  Warm, well perfused.  No edema bilaterally. Skin: No rashes or lesions noted. GU: Normal appearing external genitalia without erythema, excoriation, or lesions.  Speculum exam reveals multiparous but grossly normal appearing cervix.  Bimanual exam reveals normal cervix on palpation. Exam chaperoned by Joylene John, NP   COLPOSCOPY PROCEDURE NOTE  Procedure Details: After appropriate verbal informed consent was obtained, a timeout was performed. A sterile speculum was placed in the vagina. Acetic acid was applied to the cervix with the findings as noted below. The cervix was then cleaned with betadine x3. A single-tooth tenaculum was placed on the anterior lip of the cervix. A biopsy was obtained at 1 o'clock with tischler forceps. An endocervical curettage was also performed. Silver nitrite was applied with hemostasis achieved. The tenaculum was removed, and tenaculum sites were noted to be hemostatic. The speculum was removed from the vagina. The patient tolerated the procedure well.   Adequate Exam: Transformation zone not visualized  Biopsy Specimen: 1  o'clock, ECC  Condition: Stable. Patient tolerated procedure well.  Complications: None  Findings: 47m acetowhite change at 1 o'clock on face of cervix. Acetowhite changes of endocervical os.   Colposcopic Impression: mild dysplasia   Laboratory & Radiologic Studies: Component 2 wk ago  High risk HPV Positive Abnormal   HPV 16 Negative  HPV 18 / 45 Positive Abnormal   Adequacy Satisfactory for evaluation; transformation zone component PRESENT.  Diagnosis - Atypical squamous cells, cannot exclude high grade squamous Abnormal   Diagnosis intraepithelial lesion (ASC-H) Abnormal   Comment Normal Reference Range HPV - Negative  Comment Normal Reference Range HPV 16- Negative  Comment Normal Reference Range HPV 16 18 45 -Negative

## 2023-02-09 NOTE — Patient Instructions (Signed)
It was a pleasure to see you in clinic today. - I will let you know when I have the results from the biopsies today. Will let you know if that changes the timing of our surgery at all.  Thank you very much for allowing me to provide care for you today.  I appreciate your confidence in choosing our Gynecologic Oncology team at [facility].  If you have any questions about your visit today please call our office or send [place] a MyChart message and we will get back to you as soon as possible.

## 2023-02-11 ENCOUNTER — Telehealth: Payer: Self-pay

## 2023-02-11 ENCOUNTER — Encounter (HOSPITAL_COMMUNITY): Admission: RE | Admit: 2023-02-11 | Payer: PRIVATE HEALTH INSURANCE | Source: Ambulatory Visit

## 2023-02-11 LAB — SURGICAL PATHOLOGY

## 2023-02-11 NOTE — Telephone Encounter (Signed)
LM for Ms Asuncion noting the results of her biopsies ans noted below by Joylene John, NP.

## 2023-02-11 NOTE — Telephone Encounter (Signed)
-----   Message from Dorothyann Gibbs, NP sent at 02/11/2023 12:31 PM EST ----- Please let her know the biopsies and sampling from the endocervix was negative for precancer or cancer. Surgery can remain as scheduled at the end of the month.

## 2023-02-23 ENCOUNTER — Telehealth: Payer: Self-pay | Admitting: Surgery

## 2023-02-23 NOTE — Telephone Encounter (Signed)
Pre-op call. LVM

## 2023-02-23 NOTE — Telephone Encounter (Signed)
Telephone call to check on pre-operative status.  Patient compliant with pre-operative instructions.  Reinforced nothing to eat after midnight. Clear liquids until 7am. Patient to arrive at 8am. Verified that post-op medications have been sent to patient's preferred pharmacy.  No questions or concerns voiced.  Instructed to call for any needs.

## 2023-02-24 ENCOUNTER — Other Ambulatory Visit: Payer: Self-pay

## 2023-02-24 ENCOUNTER — Encounter (HOSPITAL_COMMUNITY)
Admission: RE | Disposition: A | Discharge status: Home / Self Care | Payer: Self-pay | Source: Ambulatory Visit | Attending: Psychiatry

## 2023-02-24 ENCOUNTER — Encounter (HOSPITAL_COMMUNITY): Payer: Self-pay | Admitting: Psychiatry

## 2023-02-24 ENCOUNTER — Ambulatory Visit (HOSPITAL_COMMUNITY): Payer: BC Managed Care – PPO | Admitting: Anesthesiology

## 2023-02-24 ENCOUNTER — Ambulatory Visit (HOSPITAL_COMMUNITY)
Admission: RE | Admit: 2023-02-24 | Discharge: 2023-02-25 | Disposition: A | Discharge status: Home / Self Care | Payer: BC Managed Care – PPO | Source: Ambulatory Visit | Attending: Psychiatry | Admitting: Psychiatry

## 2023-02-24 DIAGNOSIS — N907 Vulvar cyst: Secondary | ICD-10-CM | POA: Insufficient documentation

## 2023-02-24 DIAGNOSIS — F17210 Nicotine dependence, cigarettes, uncomplicated: Secondary | ICD-10-CM | POA: Insufficient documentation

## 2023-02-24 DIAGNOSIS — C541 Malignant neoplasm of endometrium: Secondary | ICD-10-CM | POA: Diagnosis present

## 2023-02-24 DIAGNOSIS — D251 Intramural leiomyoma of uterus: Secondary | ICD-10-CM | POA: Diagnosis not present

## 2023-02-24 DIAGNOSIS — N950 Postmenopausal bleeding: Secondary | ICD-10-CM | POA: Insufficient documentation

## 2023-02-24 HISTORY — PX: LYMPH NODE DISSECTION: SHX5087

## 2023-02-24 HISTORY — PX: ROBOTIC ASSISTED TOTAL HYSTERECTOMY WITH BILATERAL SALPINGO OOPHERECTOMY: SHX6086

## 2023-02-24 HISTORY — PX: SENTINEL NODE BIOPSY: SHX6608

## 2023-02-24 LAB — TYPE AND SCREEN
ABO/RH(D): O NEG
Antibody Screen: NEGATIVE

## 2023-02-24 LAB — ABO/RH: ABO/RH(D): O NEG

## 2023-02-24 SURGERY — HYSTERECTOMY, TOTAL, ROBOT-ASSISTED, LAPAROSCOPIC, WITH BILATERAL SALPINGO-OOPHORECTOMY
Anesthesia: General

## 2023-02-24 MED ORDER — MIDAZOLAM HCL 5 MG/5ML IJ SOLN
INTRAMUSCULAR | Status: DC | PRN
  Administered 2023-02-24: 2 mg via INTRAVENOUS

## 2023-02-24 MED ORDER — ONDANSETRON HCL 4 MG/2ML IJ SOLN
INTRAMUSCULAR | Status: DC | PRN
  Administered 2023-02-24: 4 mg via INTRAVENOUS

## 2023-02-24 MED ORDER — ROCURONIUM BROMIDE 100 MG/10ML IV SOLN
INTRAVENOUS | Status: DC | PRN
  Administered 2023-02-24: 80 mg via INTRAVENOUS
  Administered 2023-02-24 (×2): 20 mg via INTRAVENOUS

## 2023-02-24 MED ORDER — KETAMINE HCL 50 MG/5ML IJ SOSY
PREFILLED_SYRINGE | INTRAMUSCULAR | Status: AC
  Filled 2023-02-24: qty 5

## 2023-02-24 MED ORDER — LIDOCAINE HCL (PF) 2 % IJ SOLN: INTRAMUSCULAR | Status: DC | PRN

## 2023-02-24 MED ORDER — ACETAMINOPHEN 500 MG PO TABS
500.0000 mg | ORAL_TABLET | Freq: Four times a day (QID) | ORAL | Status: DC
  Administered 2023-02-24: 500 mg via ORAL
  Filled 2023-02-24: qty 1

## 2023-02-24 MED ORDER — DEXAMETHASONE SODIUM PHOSPHATE 10 MG/ML IJ SOLN
INTRAMUSCULAR | Status: AC
  Filled 2023-02-24: qty 1

## 2023-02-24 MED ORDER — MIDAZOLAM HCL 2 MG/2ML IJ SOLN
INTRAMUSCULAR | Status: AC
  Filled 2023-02-24: qty 2

## 2023-02-24 MED ORDER — ALBUMIN HUMAN 5 % IV SOLN
INTRAVENOUS | Status: AC
  Filled 2023-02-24: qty 500

## 2023-02-24 MED ORDER — EPHEDRINE SULFATE (PRESSORS) 50 MG/ML IJ SOLN
INTRAMUSCULAR | Status: DC | PRN
  Administered 2023-02-24 (×2): 10 mg via INTRAVENOUS
  Administered 2023-02-24 (×2): 5 mg via INTRAVENOUS
  Administered 2023-02-24: 10 mg via INTRAVENOUS

## 2023-02-24 MED ORDER — DEXAMETHASONE SODIUM PHOSPHATE 4 MG/ML IJ SOLN: 4.0000 mg | INTRAMUSCULAR | Status: DC

## 2023-02-24 MED ORDER — ENOXAPARIN SODIUM 40 MG/0.4ML IJ SOSY: 40.0000 mg | PREFILLED_SYRINGE | INTRAMUSCULAR | Status: DC

## 2023-02-24 MED ORDER — ORAL CARE MOUTH RINSE: 15.0000 mL | Freq: Once | OROMUCOSAL | Status: AC

## 2023-02-24 MED ORDER — SENNOSIDES-DOCUSATE SODIUM 8.6-50 MG PO TABS
2.0000 | ORAL_TABLET | Freq: Every day | ORAL | Status: DC
  Administered 2023-02-24: 2 via ORAL
  Filled 2023-02-24: qty 2

## 2023-02-24 MED ORDER — ONDANSETRON HCL 4 MG/2ML IJ SOLN: 4.0000 mg | Freq: Once | INTRAMUSCULAR | Status: DC | PRN

## 2023-02-24 MED ORDER — LIDOCAINE HCL (CARDIAC) PF 100 MG/5ML IV SOSY
PREFILLED_SYRINGE | INTRAVENOUS | Status: DC | PRN
  Administered 2023-02-24: 50 mg via INTRAVENOUS

## 2023-02-24 MED ORDER — ACETAMINOPHEN 500 MG PO TABS
1000.0000 mg | ORAL_TABLET | ORAL | Status: AC
  Administered 2023-02-24: 1000 mg via ORAL
  Filled 2023-02-24: qty 2

## 2023-02-24 MED ORDER — KETOROLAC TROMETHAMINE 15 MG/ML IJ SOLN: 15.0000 mg | INTRAMUSCULAR | Status: DC

## 2023-02-24 MED ORDER — FENTANYL CITRATE (PF) 250 MCG/5ML IJ SOLN
INTRAMUSCULAR | Status: DC | PRN
  Administered 2023-02-24: 100 ug via INTRAVENOUS
  Administered 2023-02-24 (×3): 50 ug via INTRAVENOUS

## 2023-02-24 MED ORDER — ONDANSETRON HCL 4 MG/2ML IJ SOLN
INTRAMUSCULAR | Status: AC
  Filled 2023-02-24: qty 2

## 2023-02-24 MED ORDER — ALBUMIN HUMAN 5 % IV SOLN: INTRAVENOUS | Status: DC | PRN

## 2023-02-24 MED ORDER — ALBUTEROL SULFATE HFA 108 (90 BASE) MCG/ACT IN AERS
INHALATION_SPRAY | RESPIRATORY_TRACT | Status: DC | PRN
  Administered 2023-02-24 (×3): 2 via RESPIRATORY_TRACT

## 2023-02-24 MED ORDER — KETOROLAC TROMETHAMINE 30 MG/ML IJ SOLN: 30.0000 mg | Freq: Once | INTRAMUSCULAR | Status: DC | PRN

## 2023-02-24 MED ORDER — HYDROMORPHONE HCL 1 MG/ML IJ SOLN: 0.2000 mg | INTRAMUSCULAR | Status: DC | PRN

## 2023-02-24 MED ORDER — ONDANSETRON HCL 4 MG/2ML IJ SOLN
4.0000 mg | Freq: Four times a day (QID) | INTRAMUSCULAR | Status: DC | PRN
  Filled 2023-02-24: qty 2

## 2023-02-24 MED ORDER — HYDROMORPHONE HCL 1 MG/ML IJ SOLN: 0.2500 mg | INTRAMUSCULAR | Status: DC | PRN

## 2023-02-24 MED ORDER — SODIUM CHLORIDE 0.9 % IV SOLN: INTRAVENOUS | Status: DC

## 2023-02-24 MED ORDER — TROLAMINE SALICYLATE 10 % EX CREA: 1.0000 | TOPICAL_CREAM | CUTANEOUS | Status: DC | PRN

## 2023-02-24 MED ORDER — SUGAMMADEX SODIUM 200 MG/2ML IV SOLN
INTRAVENOUS | Status: DC | PRN
  Administered 2023-02-24: 400 mg via INTRAVENOUS

## 2023-02-24 MED ORDER — FENTANYL CITRATE (PF) 100 MCG/2ML IJ SOLN
INTRAMUSCULAR | Status: AC
  Filled 2023-02-24: qty 2

## 2023-02-24 MED ORDER — KETAMINE HCL 10 MG/ML IJ SOLN
INTRAMUSCULAR | Status: DC | PRN
  Administered 2023-02-24: 10 mg via INTRAVENOUS
  Administered 2023-02-24: 20 mg via INTRAVENOUS

## 2023-02-24 MED ORDER — PROPOFOL 10 MG/ML IV BOLUS
INTRAVENOUS | Status: DC | PRN
  Administered 2023-02-24: 150 mg via INTRAVENOUS

## 2023-02-24 MED ORDER — HEPARIN SODIUM (PORCINE) 5000 UNIT/ML IJ SOLN
5000.0000 [IU] | INTRAMUSCULAR | Status: AC
  Administered 2023-02-24: 5000 [IU] via SUBCUTANEOUS
  Filled 2023-02-24: qty 1

## 2023-02-24 MED ORDER — FENTANYL CITRATE (PF) 100 MCG/2ML IJ SOLN
INTRAMUSCULAR | Status: DC | PRN
  Administered 2023-02-24: 100 ug via INTRAVENOUS

## 2023-02-24 MED ORDER — DROPERIDOL 2.5 MG/ML IJ SOLN
INTRAMUSCULAR | Status: DC | PRN
  Administered 2023-02-24: .625 mg via INTRAVENOUS

## 2023-02-24 MED ORDER — TRAMADOL HCL 50 MG PO TABS
50.0000 mg | ORAL_TABLET | Freq: Four times a day (QID) | ORAL | Status: DC | PRN
  Filled 2023-02-24: qty 1

## 2023-02-24 MED ORDER — FENTANYL CITRATE (PF) 250 MCG/5ML IJ SOLN
INTRAMUSCULAR | Status: AC
  Filled 2023-02-24: qty 5

## 2023-02-24 MED ORDER — CEFAZOLIN SODIUM-DEXTROSE 2-4 GM/100ML-% IV SOLN
2.0000 g | INTRAVENOUS | Status: AC
  Administered 2023-02-24: 2 g via INTRAVENOUS
  Filled 2023-02-24: qty 100

## 2023-02-24 MED ORDER — STERILE WATER FOR INJECTION IJ SOLN
INTRAMUSCULAR | Status: AC
  Filled 2023-02-24: qty 10

## 2023-02-24 MED ORDER — SIMETHICONE 80 MG PO CHEW
80.0000 mg | CHEWABLE_TABLET | Freq: Four times a day (QID) | ORAL | Status: DC | PRN
  Filled 2023-02-24: qty 1

## 2023-02-24 MED ORDER — ROCURONIUM BROMIDE 10 MG/ML (PF) SYRINGE
PREFILLED_SYRINGE | INTRAVENOUS | Status: AC
  Filled 2023-02-24: qty 10

## 2023-02-24 MED ORDER — IPRATROPIUM-ALBUTEROL 0.5-2.5 (3) MG/3ML IN SOLN
3.0000 mL | Freq: Once | RESPIRATORY_TRACT | Status: AC
  Administered 2023-02-24: 3 mL via RESPIRATORY_TRACT

## 2023-02-24 MED ORDER — BUPIVACAINE HCL 0.25 % IJ SOLN
INTRAMUSCULAR | Status: AC
  Filled 2023-02-24: qty 1

## 2023-02-24 MED ORDER — STERILE WATER FOR INJECTION IJ SOLN
INTRAMUSCULAR | Status: DC | PRN
  Administered 2023-02-24: 10 mL

## 2023-02-24 MED ORDER — DEXAMETHASONE SODIUM PHOSPHATE 10 MG/ML IJ SOLN
INTRAMUSCULAR | Status: DC | PRN
  Administered 2023-02-24: 8 mg via INTRAVENOUS

## 2023-02-24 MED ORDER — LIDOCAINE HCL (PF) 2 % IJ SOLN
INTRAMUSCULAR | Status: AC
  Filled 2023-02-24: qty 10

## 2023-02-24 MED ORDER — ONDANSETRON HCL 4 MG PO TABS: 4.0000 mg | ORAL_TABLET | Freq: Four times a day (QID) | ORAL | Status: DC | PRN

## 2023-02-24 MED ORDER — NICOTINE 21 MG/24HR TD PT24: 21.0000 mg | MEDICATED_PATCH | Freq: Every day | TRANSDERMAL | Status: DC

## 2023-02-24 MED ORDER — CHLORHEXIDINE GLUCONATE 0.12 % MT SOLN
15.0000 mL | Freq: Once | OROMUCOSAL | Status: AC
  Administered 2023-02-24: 15 mL via OROMUCOSAL

## 2023-02-24 MED ORDER — SCOPOLAMINE 1 MG/3DAYS TD PT72
1.0000 | MEDICATED_PATCH | TRANSDERMAL | Status: DC
  Administered 2023-02-24: 1.5 mg via TRANSDERMAL
  Filled 2023-02-24: qty 1

## 2023-02-24 MED ORDER — BUPIVACAINE HCL 0.25 % IJ SOLN
INTRAMUSCULAR | Status: DC | PRN
  Administered 2023-02-24: 10 mL

## 2023-02-24 MED ORDER — GABAPENTIN 300 MG PO CAPS
300.0000 mg | ORAL_CAPSULE | ORAL | Status: AC
  Administered 2023-02-24: 300 mg via ORAL
  Filled 2023-02-24: qty 1

## 2023-02-24 MED ORDER — HYDROMORPHONE HCL 2 MG PO TABS: 2.0000 mg | ORAL_TABLET | ORAL | Status: DC | PRN

## 2023-02-24 MED ORDER — PHENYLEPHRINE HCL (PRESSORS) 10 MG/ML IV SOLN
INTRAVENOUS | Status: DC | PRN
  Administered 2023-02-24 (×3): 160 ug via INTRAVENOUS

## 2023-02-24 MED ORDER — NICOTINE POLACRILEX 2 MG MT LOZG: 2.0000 mg | LOZENGE | OROMUCOSAL | Status: DC | PRN

## 2023-02-24 MED ORDER — IPRATROPIUM-ALBUTEROL 0.5-2.5 (3) MG/3ML IN SOLN
RESPIRATORY_TRACT | Status: AC
  Filled 2023-02-24: qty 3

## 2023-02-24 MED ORDER — PROPOFOL 10 MG/ML IV BOLUS
INTRAVENOUS | Status: AC
  Filled 2023-02-24: qty 20

## 2023-02-24 MED ORDER — BUPIVACAINE LIPOSOME 1.3 % IJ SUSP
INTRAMUSCULAR | Status: DC | PRN
  Administered 2023-02-24: 10 mL

## 2023-02-24 MED ORDER — LACTATED RINGERS IV SOLN: INTRAVENOUS | Status: DC

## 2023-02-24 MED ORDER — LIDOCAINE HCL (PF) 2 % IJ SOLN
INTRAMUSCULAR | Status: DC | PRN
  Administered 2023-02-24: 1.5 mg/kg/h

## 2023-02-24 SURGICAL SUPPLY — 135 items
ADH SKN CLS APL DERMABOND .7 (GAUZE/BANDAGES/DRESSINGS) ×1
AGENT HMST KT MTR STRL THRMB (HEMOSTASIS)
APL ESCP 34 STRL LF DISP (HEMOSTASIS)
APL PRP STRL LF DISP 70% ISPRP (MISCELLANEOUS) ×1
APPLICATOR SURGIFLO ENDO (HEMOSTASIS) IMPLANT
BAG COUNTER SPONGE SURGICOUNT (BAG) IMPLANT
BAG LAPAROSCOPIC 12 15 PORT 16 (BASKET) IMPLANT
BAG RETRIEVAL 12/15 (BASKET)
BAG SPNG CNTER NS LX DISP (BAG)
BLADE EXTENDED COATED 6.5IN (ELECTRODE) ×1 IMPLANT
BLADE SURG SZ10 CARB STEEL (BLADE) IMPLANT
CELLS DAT CNTRL 66122 CELL SVR (MISCELLANEOUS) IMPLANT
CHLORAPREP W/TINT 26 (MISCELLANEOUS) ×1 IMPLANT
CLIP TI LARGE 6 (CLIP) ×1 IMPLANT
CLIP TI MEDIUM 6 (CLIP) ×1 IMPLANT
CLIP TI MEDIUM LARGE 6 (CLIP) ×1 IMPLANT
CNTNR URN SCR LID CUP LEK RST (MISCELLANEOUS) IMPLANT
CONT SPEC 4OZ STRL OR WHT (MISCELLANEOUS)
COVER BACK TABLE 60X90IN (DRAPES) ×1 IMPLANT
COVER SURGICAL LIGHT HANDLE (MISCELLANEOUS) ×1 IMPLANT
COVER TIP SHEARS 8 DVNC (MISCELLANEOUS) ×1 IMPLANT
COVER TIP SHEARS 8MM DA VINCI (MISCELLANEOUS) ×1
DERMABOND ADVANCED .7 DNX12 (GAUZE/BANDAGES/DRESSINGS) ×1 IMPLANT
DRAPE ARM DVNC X/XI (DISPOSABLE) ×4 IMPLANT
DRAPE COLUMN DVNC XI (DISPOSABLE) ×1 IMPLANT
DRAPE DA VINCI XI ARM (DISPOSABLE) ×4
DRAPE DA VINCI XI COLUMN (DISPOSABLE) ×1
DRAPE INCISE IOBAN 66X45 STRL (DRAPES) IMPLANT
DRAPE SHEET LG 3/4 BI-LAMINATE (DRAPES) ×1 IMPLANT
DRAPE SURG IRRIG POUCH 19X23 (DRAPES) ×1 IMPLANT
DRAPE WARM FLUID 44X44 (DRAPES) ×1 IMPLANT
DRSG OPSITE POSTOP 4X10 (GAUZE/BANDAGES/DRESSINGS) IMPLANT
DRSG OPSITE POSTOP 4X6 (GAUZE/BANDAGES/DRESSINGS) IMPLANT
DRSG OPSITE POSTOP 4X8 (GAUZE/BANDAGES/DRESSINGS) IMPLANT
ELECT PENCIL ROCKER SW 15FT (MISCELLANEOUS) IMPLANT
ELECT REM PT RETURN 15FT ADLT (MISCELLANEOUS) ×1 IMPLANT
GAUZE 4X4 16PLY ~~LOC~~+RFID DBL (SPONGE) ×2 IMPLANT
GLOVE BIO SURGEON STRL SZ 6 (GLOVE) ×4 IMPLANT
GLOVE BIO SURGEON STRL SZ 6.5 (GLOVE) ×1 IMPLANT
GLOVE BIOGEL PI IND STRL 6.5 (GLOVE) ×2 IMPLANT
GOWN STRL REUS W/ TWL LRG LVL3 (GOWN DISPOSABLE) ×4 IMPLANT
GOWN STRL REUS W/TWL LRG LVL3 (GOWN DISPOSABLE) ×4
GRASPER SUT TROCAR 14GX15 (MISCELLANEOUS) IMPLANT
HANDLE SUCTION POOLE (INSTRUMENTS) ×1 IMPLANT
HEMOSTAT ARISTA ABSORB 3G PWDR (HEMOSTASIS) IMPLANT
HOLDER FOLEY CATH W/STRAP (MISCELLANEOUS) IMPLANT
IRRIG SUCT STRYKERFLOW 2 WTIP (MISCELLANEOUS) ×1
IRRIGATION SUCT STRKRFLW 2 WTP (MISCELLANEOUS) ×1 IMPLANT
KIT BASIN OR (CUSTOM PROCEDURE TRAY) ×1 IMPLANT
KIT PROCEDURE DA VINCI SI (MISCELLANEOUS) ×1
KIT PROCEDURE DVNC SI (MISCELLANEOUS) IMPLANT
KIT TURNOVER KIT A (KITS) IMPLANT
LIGASURE IMPACT 36 18CM CVD LR (INSTRUMENTS) ×1 IMPLANT
LOOP VESSEL MAXI BLUE (MISCELLANEOUS) ×1 IMPLANT
MANIPULATOR ADVINCU DEL 3.0 PL (MISCELLANEOUS) IMPLANT
MANIPULATOR ADVINCU DEL 3.5 PL (MISCELLANEOUS) IMPLANT
MANIPULATOR UTERINE 4.5 ZUMI (MISCELLANEOUS) IMPLANT
NDL HYPO 21X1.5 SAFETY (NEEDLE) ×1 IMPLANT
NDL INSUFFLATION 14GA 120MM (NEEDLE) IMPLANT
NDL SPNL 18GX3.5 QUINCKE PK (NEEDLE) IMPLANT
NDL SPNL 20GX3.5 QUINCKE YW (NEEDLE) ×2 IMPLANT
NEEDLE HYPO 21X1.5 SAFETY (NEEDLE) ×1 IMPLANT
NEEDLE INSUFFLATION 14GA 120MM (NEEDLE) IMPLANT
NEEDLE SPNL 18GX3.5 QUINCKE PK (NEEDLE) IMPLANT
NEEDLE SPNL 20GX3.5 QUINCKE YW (NEEDLE) ×1 IMPLANT
NS IRRIG 1000ML POUR BTL (IV SOLUTION) ×2 IMPLANT
OBTURATOR OPTICAL STANDARD 8MM (TROCAR) ×1
OBTURATOR OPTICAL STND 8 DVNC (TROCAR) ×1
OBTURATOR OPTICALSTD 8 DVNC (TROCAR) ×1 IMPLANT
PACK GENERAL/GYN (CUSTOM PROCEDURE TRAY) ×1 IMPLANT
PACK ROBOT GYN CUSTOM WL (TRAY / TRAY PROCEDURE) ×1 IMPLANT
PAD ARMBOARD 7.5X6 YLW CONV (MISCELLANEOUS) ×1 IMPLANT
PAD POSITIONING PINK XL (MISCELLANEOUS) ×1 IMPLANT
PORT ACCESS TROCAR AIRSEAL 12 (TROCAR) IMPLANT
RELOAD PROXIMATE 75MM BLUE (ENDOMECHANICALS) IMPLANT
RELOAD PROXIMATE TA60MM BLUE (ENDOMECHANICALS) IMPLANT
RELOAD STAPLE 60 BLU REG PROX (ENDOMECHANICALS) IMPLANT
RELOAD STAPLE 75 3.8 BLU REG (ENDOMECHANICALS) IMPLANT
RETRACTOR WND ALEXIS 18 MED (MISCELLANEOUS) IMPLANT
RETRACTOR WND ALEXIS 25 LRG (MISCELLANEOUS) IMPLANT
RTRCTR WOUND ALEXIS 18CM MED (MISCELLANEOUS)
RTRCTR WOUND ALEXIS 25CM LRG (MISCELLANEOUS)
SCRUB CHG 4% DYNA-HEX 4OZ (MISCELLANEOUS) ×2 IMPLANT
SEAL CANN UNIV 5-8 DVNC XI (MISCELLANEOUS) ×4 IMPLANT
SEAL XI 5MM-8MM UNIVERSAL (MISCELLANEOUS) ×4
SET TRI-LUMEN FLTR TB AIRSEAL (TUBING) ×1 IMPLANT
SHEET LAVH (DRAPES) ×1 IMPLANT
SOL PREP POV-IOD 4OZ 10% (MISCELLANEOUS) ×2 IMPLANT
SPIKE FLUID TRANSFER (MISCELLANEOUS) ×1 IMPLANT
SPONGE T-LAP 18X18 ~~LOC~~+RFID (SPONGE) ×1 IMPLANT
STAPLER GUN LINEAR PROX 60 (STAPLE) IMPLANT
STAPLER PROXIMATE 75MM BLUE (STAPLE) IMPLANT
STAPLER VISISTAT 35W (STAPLE) IMPLANT
SUCTION POOLE HANDLE (INSTRUMENTS)
SURGIFLO W/THROMBIN 8M KIT (HEMOSTASIS) IMPLANT
SUT MNCRL AB 4-0 PS2 18 (SUTURE) ×2 IMPLANT
SUT PDS AB 1 CTX 36 (SUTURE) IMPLANT
SUT PDS AB 1 TP1 54 (SUTURE) IMPLANT
SUT SILK 0 (SUTURE)
SUT SILK 0 30XBRD TIE 6 (SUTURE) ×1 IMPLANT
SUT SILK 3 0 SH CR/8 (SUTURE) IMPLANT
SUT VIC AB 0 BRD 54 (SUTURE) ×1 IMPLANT
SUT VIC AB 0 CT1 27 (SUTURE)
SUT VIC AB 0 CT1 27XBRD ANTBC (SUTURE) ×2 IMPLANT
SUT VIC AB 0 CT1 36 (SUTURE) ×4 IMPLANT
SUT VIC AB 2-0 CT1 27 (SUTURE)
SUT VIC AB 2-0 CT1 36 (SUTURE) ×2 IMPLANT
SUT VIC AB 2-0 CT1 TAPERPNT 27 (SUTURE) IMPLANT
SUT VIC AB 2-0 SH 27 (SUTURE)
SUT VIC AB 2-0 SH 27X BRD (SUTURE) ×2 IMPLANT
SUT VIC AB 3-0 CTX 36 (SUTURE) IMPLANT
SUT VIC AB 3-0 SH 18 (SUTURE) IMPLANT
SUT VIC AB 3-0 SH 27 (SUTURE)
SUT VIC AB 3-0 SH 27X BRD (SUTURE) ×1 IMPLANT
SUT VIC AB 4-0 PS2 18 (SUTURE) ×2 IMPLANT
SUT VIC AB 4-0 PS2 27 (SUTURE) IMPLANT
SUT VIC AB 4-0 SH 27 (SUTURE)
SUT VIC AB 4-0 SH 27XBRD (SUTURE) IMPLANT
SUT VICRYL 0 TIES 12 18 (SUTURE) ×1 IMPLANT
SUT VLOC 180 0 9IN  GS21 (SUTURE)
SUT VLOC 180 0 9IN GS21 (SUTURE) IMPLANT
SYR 10ML LL (SYRINGE) IMPLANT
SYR 50ML LL SCALE MARK (SYRINGE) ×2 IMPLANT
SYS BAG RETRIEVAL 10MM (BASKET) ×1
SYS WOUND ALEXIS 18CM MED (MISCELLANEOUS)
SYSTEM BAG RETRIEVAL 10MM (BASKET) IMPLANT
SYSTEM WOUND ALEXIS 18CM MED (MISCELLANEOUS) IMPLANT
TOWEL OR 17X26 10 PK STRL BLUE (TOWEL DISPOSABLE) ×1 IMPLANT
TOWEL OR NON WOVEN STRL DISP B (DISPOSABLE) IMPLANT
TRAP SPECIMEN MUCUS 40CC (MISCELLANEOUS) IMPLANT
TRAY FOLEY MTR SLVR 16FR STAT (SET/KITS/TRAYS/PACK) ×1 IMPLANT
TROCAR PORT AIRSEAL 5X120 (TROCAR) IMPLANT
UNDERPAD 30X36 HEAVY ABSORB (UNDERPADS AND DIAPERS) ×2 IMPLANT
WATER STERILE IRR 1000ML POUR (IV SOLUTION) ×1 IMPLANT
YANKAUER SUCT BULB TIP 10FT TU (MISCELLANEOUS) IMPLANT

## 2023-02-24 NOTE — Op Note (Addendum)
GYNECOLOGIC ONCOLOGY OPERATIVE NOTE  Date of Service: 02/24/2023  Preoperative Diagnosis: FIGO grade 1 endometrioid endometrial cancer  Postoperative Diagnosis: Same  Procedures: Robotic-assisted total laparoscopic hysterectomy, bilateral salpingo-oophorectomy, bilateral sentinel lymph node evaluation and biopsy  Surgeon: [name], MD  Assistants: Joylene John, NP  Anesthesia: General  Estimated Blood Loss: 50 mL    Fluids: 1750 ml, crystalloid  Urine Output: 400 ml, clear yellow  Findings: Normal upper abdominal survey with normal liver surface and diaphragm. Normal appearing small and large bowel. Normal uterus, tubes, and ovaries. No evidence of peritoneal disease, ascites, or carcinomatosis. Adhesions of the sigmoid colon to the left pelvic sidewall. Sentinel mapping on right to the external iliac; sentinel mapping on left to the obturator space. At least one additional prominent lymph node in the left obturator space adjacent to the sentinel lymph node so these were taken out as one specimen. Approximately 9m brown cystic structure on right labia minora, excised, drainage of greenish/brown fluid.   Specimens:  ID Type Source Tests Collected by Time Destination  1 : LEFT OBTURATOR SENTINEL LYMPH NODE AND ADJACENT PROMINENTLYMPH NODES Tissue PATH Sentinel Lymph Node SURGICAL PATHOLOGY NBernadene Bell MD 02/24/2023 1841   2 : RIGHT EXTERNAL ILIAC SENTINEL LYMPH NODE Tissue PATH Sentinel Lymph Node SURGICAL PATHOLOGY NBernadene Bell MD 02/24/2023 1845   4 : UTERUS  CERVIX BILATERAL TUBES AND OVARIES GYN Bilateral Tubes, Ovaries, Cervix, and Uterus SURGICAL PATHOLOGY NBernadene Bell MD 02/24/2023 1938   5 : RIGHT LABIAL CYST Tissue PATH Gyn tumor resection SURGICAL PATHOLOGY NBernadene Bell MD [id number]     Complications:  None  Indications for Procedure: Becky CIARAVINOis a 57y.o. woman with FIGO grade 1 endometrial cancer.  Prior to the procedure, all risks,  benefits, and alternatives were discussed and informed surgical consent was signed.  Procedure: Patient was taken to the operating room where general anesthesia was achieved.  She was positioned in dorsal lithotomy and prepped and draped.  A foley catheter was inserted into the bladder. 1 ml of dilute Indo-Cyanine dye was was injected at 1cm and 111mdeep at 3 and 9 o'clock in the cervical stroma.  The cervix was dilated and an Advincula uterine manipulator with a colpotomy ring was inserted into the uterus.  A 10 mm incision was made in the left upper quadrant near Palmer's point.  The abdomen was entered with a 5 mm OptiView trocar under direct visualization.  The abdomen was insufflated, the patient placed in steep Trendelenburg, and additional trocars were placed as follows: an 74m65mrocar superior to the umbilicus, two 8 mm robotic trocars in the right abdomen, and one 8 mm robotic trocar in the left abdomen.  The left upper quadrant trocar was removed and replaced with a 12 mm airseal trocar.  All trocars were placed under direct visualization.  The bowels were moved into the upper abdomen.  The DaVinci robotic surgical system was brought to the patient's bedside and docked.  The right round ligament was transected and the retroperitoneum entered. The right ureter was identified. The paravesical and pararectal spaces were opened, and the node was found to be located right externa iliac artery. A sentinel lymph node dissection was performed taking care to avoid injury to the ureter, superior vesicle artery or obturator nerve. A similar procedure was performed on left with the sentinel lymph node identified at the left obturator space. At least one additional prominent lymph node adjacent to the sentinel lymph node was taken out  as one specimen. The sentinel lymph nodes mentioned above were identified and removed through the assistant trocar.  The left ureter was again identified, and the left  infundibulopelvic ligament was isolated, cauterized, and transected. The posterior peritoneum was opened to the colpotomy ring. The anterior peritoneum was opened and the bladder flap was created. The left uterine artery was skeletonized, cauterized, and transected at the level of the colpotomy ring. Additional cautery was used in a C-shaped fashion to allow the remainder of the broad, cardinal, and uterosacral ligaments with the uterine vessels to be transected and fall away from the colpotomy ring.  A similar procedure was performed on the contralateral side. A colpotomy was made circumferentially following the contours of the colpotomy ring.  The uterine specimen was removed through the vagina.    The vaginal cuff was closed with a running stitch of 0 Vicryl suture.  The pelvis was irrigated and all operative sites were found to be hemostatic.  All instruments were removed and the robot was taken from the patient's bedside. The fascia at the 12 mm incision was closed with 0 Vicryl using a PMI device. The abdomen was desufflated and all ports were removed. The skin at all incisions was closed with 4-0 Vicryl to reapproximate the subcutaneous tissue and 4-0 monocryl in a subcuticular fashion followed by surgical glue.  At conclusion of case, approximately 79m cystic structure noted on the medial right labia minora. This was excised with a scalpel. The area was reaproximated with 2 figure of eight stitches with 3-0 vicryl with hemostasis achieved.  Patient tolerated the procedure well. Sponge, lap, and instrument counts were correct.  Patient received 2 gm of Ancef prior to skin incision for routine perioperative antibiotic prophylaxis.  She was extubated and taken to the PACU in stable condition.  MBernadene Bell MD Gynecologic Oncology

## 2023-02-24 NOTE — Interval H&P Note (Signed)
History and Physical Interval Note:  02/24/2023 3:11 PM  Becky Sanchez  has presented today for surgery, with the diagnosis of ENDOMETRIAL CANCER.  The various methods of treatment have been discussed with the patient and family. After consideration of risks, benefits and other options for treatment, the patient has consented to  Procedure(s): XI ROBOTIC ASSISTED TOTAL HYSTERECTOMY WITH BILATERAL SALPINGO OOPHORECTOMY (N/A) SENTINEL NODE BIOPSY (N/A) POSSIBLE LYMPH NODE DISSECTION (N/A) POSSIBLE LAPAROTOMY (N/A) as a surgical intervention.  The patient's history has been reviewed, patient examined, no change in status, stable for surgery.  I have reviewed the patient's chart and labs.  Questions were answered to the patient's satisfaction.     [name]

## 2023-02-24 NOTE — Anesthesia Procedure Notes (Signed)
Date/Time: 02/24/2023 8:17 PM  Performed by: Cynda Familia, CRNAOxygen Delivery Method: Simple face mask Placement Confirmation: positive ETCO2 and breath sounds checked- equal and bilateral Dental Injury: Teeth and Oropharynx as per pre-operative assessment

## 2023-02-24 NOTE — Anesthesia Procedure Notes (Signed)
Procedure Name: Intubation Date/Time: 02/24/2023 5:46 PM  Performed by: Jonna Munro, CRNAPre-anesthesia Checklist: Patient identified, Emergency Drugs available, Suction available, Patient being monitored and Timeout performed Patient Re-evaluated:Patient Re-evaluated prior to induction Oxygen Delivery Method: Circle system utilized Preoxygenation: Pre-oxygenation with 100% oxygen Induction Type: IV induction Ventilation: Mask ventilation without difficulty Laryngoscope Size: Mac and 3 Grade View: Grade I Tube type: Oral Tube size: 7.0 mm Number of attempts: 1 Airway Equipment and Method: Stylet Placement Confirmation: ETT inserted through vocal cords under direct vision, positive ETCO2, CO2 detector and breath sounds checked- equal and bilateral Secured at: 22 cm Tube secured with: Tape Dental Injury: Teeth and Oropharynx as per pre-operative assessment

## 2023-02-24 NOTE — Transfer of Care (Signed)
Immediate Anesthesia Transfer of Care Note  Patient: Becky Sanchez  Procedure(s) Performed: XI ROBOTIC ASSISTED TOTAL HYSTERECTOMY WITH BILATERAL SALPINGO OOPHORECTOMY SENTINEL NODE BIOPSY POSSIBLE LYMPH NODE DISSECTION  Patient Location: PACU  Anesthesia Type:General  Level of Consciousness: sedated  Airway & Oxygen Therapy: Patient Spontanous Breathing and Patient connected to face mask oxygen  Post-op Assessment: Report given to RN and Post -op Vital signs reviewed and stable  Post vital signs: Reviewed and stable  Last Vitals:  Vitals Value Taken Time  BP 90/68 02/24/23 2022  Temp    Pulse 87 02/24/23 2023  Resp 14 02/24/23 2023  SpO2 92 % 02/24/23 2023  Vitals shown include unvalidated device data.  Last Pain:  Vitals:   02/24/23 0854  TempSrc:   PainSc: 0-No pain         Complications: No notable events documented.

## 2023-02-24 NOTE — Anesthesia Preprocedure Evaluation (Addendum)
Anesthesia Evaluation  Patient identified by MRN, date of birth, ID band Patient awake    Reviewed: Allergy & Precautions, H&P , NPO status , Patient's Chart, lab work & pertinent test results  Airway Mallampati: II  TM Distance: >3 FB Neck ROM: Full    Dental no notable dental hx.    Pulmonary Current SmokerPatient did not abstain from smoking.   Pulmonary exam normal breath sounds clear to auscultation       Cardiovascular negative cardio ROS Normal cardiovascular exam Rhythm:Regular Rate:Normal     Neuro/Psych negative neurological ROS  negative psych ROS   GI/Hepatic negative GI ROS, Neg liver ROS,,,  Endo/Other  negative endocrine ROS    Renal/GU negative Renal ROS  negative genitourinary   Musculoskeletal negative musculoskeletal ROS (+)    Abdominal   Peds negative pediatric ROS (+)  Hematology negative hematology ROS (+)   Anesthesia Other Findings   Reproductive/Obstetrics negative OB ROS                             Anesthesia Physical Anesthesia Plan  ASA: 2  Anesthesia Plan: General   Post-op Pain Management: Tylenol PO (pre-op)* and Gabapentin PO (pre-op)*   Induction: Intravenous  PONV Risk Score and Plan: 2 and Ondansetron, Dexamethasone and Treatment may vary due to age or medical condition  Airway Management Planned: Oral ETT  Additional Equipment:   Intra-op Plan:   Post-operative Plan: Extubation in OR  Informed Consent: I have reviewed the patients History and Physical, chart, labs and discussed the procedure including the risks, benefits and alternatives for the proposed anesthesia with the patient or authorized representative who has indicated his/her understanding and acceptance.     Dental advisory given  Plan Discussed with: CRNA and Surgeon  Anesthesia Plan Comments:        Anesthesia Quick Evaluation

## 2023-02-25 ENCOUNTER — Telehealth: Payer: Self-pay

## 2023-02-25 ENCOUNTER — Encounter (HOSPITAL_COMMUNITY): Payer: Self-pay | Admitting: Psychiatry

## 2023-02-25 DIAGNOSIS — C541 Malignant neoplasm of endometrium: Secondary | ICD-10-CM | POA: Diagnosis not present

## 2023-02-25 LAB — BASIC METABOLIC PANEL
Anion gap: 9 (ref 5–15)
BUN: 11 mg/dL (ref 6–20)
CO2: 23 mmol/L (ref 22–32)
Calcium: 9.1 mg/dL (ref 8.9–10.3)
Chloride: 105 mmol/L (ref 98–111)
Creatinine, Ser: 0.93 mg/dL (ref 0.44–1.00)
GFR, Estimated: >60 mL/min (ref >60)
Glucose, Bld: 201 mg/dL — ABNORMAL HIGH (ref 70–99)
Potassium: 4 mmol/L (ref 3.5–5.1)
Sodium: 137 mmol/L (ref 135–145)

## 2023-02-25 LAB — CBC
HCT: 37.1 % (ref 36.0–46.0)
Hemoglobin: 11.7 g/dL — ABNORMAL LOW (ref 12.0–15.0)
MCH: 29.5 pg (ref 26.0–34.0)
MCHC: 31.5 g/dL (ref 30.0–36.0)
MCV: 93.7 fL (ref 80.0–100.0)
Platelets: 311 10*3/uL (ref 150–400)
RBC: 3.96 MIL/uL (ref 3.87–5.11)
RDW: 12.9 % (ref 11.5–15.5)
WBC: 15.4 10*3/uL — ABNORMAL HIGH (ref 4.0–10.5)
nRBC: 0 % (ref 0.0–0.2)

## 2023-02-25 NOTE — Progress Notes (Signed)
Pt up in chair, givein Incentive spirometer, demonstrated correct use reaching up to 1250. Explained importance and actions of the IS to patient and told her she should use 10xh even at home. Pt understood with no questions.

## 2023-02-25 NOTE — Discharge Instructions (Signed)
AFTER SURGERY INSTRUCTIONS   Return to work: 4-6 weeks if applicable   Activity: 1. Be up and out of the bed during the day.  Take a nap if needed.  You may walk up steps but be careful and use the hand rail.  Stair climbing will tire you more than you think, you may need to stop part way and rest.    2. No lifting or straining for 6 weeks over 10 pounds. No pushing, pulling, straining for 6 weeks.   3. No driving for around 1 week(s).  Do not drive if you are taking narcotic pain medicine and make sure that your reaction time has returned.    4. You can shower as soon as the next day after surgery. Shower daily.  Use your regular soap and water (not directly on the incision) and pat your incision(s) dry afterwards; don't rub.  No tub baths or submerging your body in water until cleared by your surgeon. If you have the soap that was given to you by pre-surgical testing that was used before surgery, you do not need to use it afterwards because this can irritate your incisions.    5. No sexual activity and nothing in the vagina for 12 weeks.   6. You may experience a small amount of clear drainage from your incisions, which is normal.  If the drainage persists, increases, or changes color please call the office.   7. Do not use creams, lotions, or ointments such as neosporin on your incisions after surgery until advised by your surgeon because they can cause removal of the dermabond glue on your incisions.     8. You may experience vaginal spotting after surgery or around the 6-8 week mark from surgery when the stitches at the top of the vagina begin to dissolve.  The spotting is normal but if you experience heavy bleeding, call our office.   9. Take Tylenol or ibuprofen (OR NAPROXEN (ALEVE), DO NOT TAKE TOGETHER) first for pain if you are able to take these medications and only use narcotic pain medication for severe pain not relieved by the Tylenol or Ibuprofen.  Monitor your Tylenol intake to a  max of 4,000 mg in a 24 hour period. You can alternate these medications after surgery.   Diet: 1. Low sodium Heart Healthy Diet is recommended but you are cleared to resume your normal (before surgery) diet after your procedure.   2. It is safe to use a laxative, such as Miralax or Colace, if you have difficulty moving your bowels. You have been prescribed Sennakot-S to take at bedtime every evening after surgery to keep bowel movements regular and to prevent constipation.     Wound Care: 1. Keep clean and dry.  Shower daily.   Reasons to call the Doctor: Fever - Oral temperature greater than 100.4 degrees Fahrenheit Foul-smelling vaginal discharge Difficulty urinating Nausea and vomiting Increased pain at the site of the incision that is unrelieved with pain medicine. Difficulty breathing with or without chest pain New calf pain especially if only on one side Sudden, continuing increased vaginal bleeding with or without clots.   Contacts: For questions or concerns you should contact:   Dr. [name] at [place], NP at [phone]   After Hours: call [phone] and have the GYN Oncologist paged/contacted (after 5 pm or on the weekends).   Messages sent via mychart are for non-urgent matters and are not responded to after hours so for urgent  needs, please call the after hours number.

## 2023-02-25 NOTE — Discharge Summary (Signed)
Physician Discharge Summary  Patient ID: Becky Sanchez MRN: [id number] DOB/AGE: 1966/03/19 57 y.o.  Admit date: 02/24/2023 Discharge date: 02/25/2023  Admission Diagnoses: Endometrial cancer [facility])  Discharge Diagnoses:  Principal Problem:   Endometrial cancer [facility])   Discharged Condition:  The patient is in good condition and stable for discharge.    Hospital Course: On 02/24/2023, the patient underwent the following: Procedure(s): XI ROBOTIC ASSISTED TOTAL HYSTERECTOMY WITH BILATERAL SALPINGO OOPHORECTOMY SENTINEL NODE BIOPSY POSSIBLE LYMPH NODE DISSECTION. The postoperative course was uneventful.  She was discharged to home on postoperative day 1 tolerating a regular diet, ambulating, voiding, pain controlled with oral medications.   Consults: None  Significant Diagnostic Studies: am labs  Treatments: surgery: see above  Discharge Exam: Blood pressure 114/60, pulse 97, temperature 98.3 F (36.8 C), temperature source Oral, resp. rate 17, height '5\' 2"'$  (1.575 m), weight 160 lb 15 oz (73 kg), SpO2 97 %. General: alert, cooperative, and no distress Resp: clear to auscultation bilaterally Cardio: regular rate and rhythm, S1, S2 normal, no murmur, click, rub or gallop GI: soft, non-tender; bowel sounds normal; no masses,  no organomegaly and incision: lap sites to the abdomen with dermabond intact with no drainage or bleeding Extremities: extremities normal, atraumatic, no cyanosis or edema  Disposition: Discharge disposition: 01-Home or Self Care       Discharge Instructions     Call MD for:  difficulty breathing, headache or visual disturbances   Complete by: As directed    Call MD for:  extreme fatigue   Complete by: As directed    Call MD for:  hives   Complete by: As directed    Call MD for:  persistant dizziness or light-headedness   Complete by: As directed    Call MD for:  persistant nausea and vomiting   Complete by: As directed    Call MD for:  redness,  tenderness, or signs of infection (pain, swelling, redness, odor or green/yellow discharge around incision site)   Complete by: As directed    Call MD for:  severe uncontrolled pain   Complete by: As directed    Call MD for:  temperature >100.4   Complete by: As directed    Diet - low sodium heart healthy   Complete by: As directed    Driving Restrictions   Complete by: As directed    No driving for 1 week(s).  Do not take narcotics and drive. You need to make sure your reaction time has returned.   Increase activity slowly   Complete by: As directed    Lifting restrictions   Complete by: As directed    No lifting greater than 10 lbs, pushing, pulling, straining for 6 weeks.   Sexual Activity Restrictions   Complete by: As directed    No sexual activity, nothing in the vagina, for 12 weeks.      Allergies as of 02/25/2023       Reactions   Percocet [oxycodone-acetaminophen] Hives        Medication List     TAKE these medications    MOVE FREE JOINT HEALTH ADVANCE PO Take 1 tablet by mouth daily.   multivitamin with minerals Tabs tablet Take 1 tablet by mouth daily.   naproxen sodium 220 MG tablet Commonly known as: ALEVE Take 220 mg by mouth daily as needed (pain).   nicotine 21 mg/24hr patch Commonly known as: NICODERM CQ - dosed in mg/24 hours Place 1 patch (21 mg total) onto the skin daily.  nicotine polacrilex 2 MG lozenge Commonly known as: Nicotine Mini Take 1 lozenge (2 mg total) by mouth as needed for smoking cessation.   senna-docusate 8.6-50 MG tablet Commonly known as: Senokot-S Take 2 tablets by mouth at bedtime. For AFTER surgery, do not take if having diarrhea   traMADol 50 MG tablet Commonly known as: ULTRAM Take 1 tablet (50 mg total) by mouth every 6 (six) hours as needed for severe pain. For AFTER surgery only, do not take and drive   trolamine salicylate 10 % cream Commonly known as: ASPERCREME Apply 1 Application topically as needed  for muscle pain.        Follow-up Information     [name], MD Follow up on 03/09/2023.   Specialty: Gynecologic Oncology Why: at 2:15pm at the [facility] information: [place] [address] [phone]                 Greater than thirty minutes were spend for face to face discharge instructions and discharge orders/summary in EPIC.   Signed: Dorothyann Gibbs 02/25/2023, 7:47 AM

## 2023-02-25 NOTE — TOC CM/SW Note (Signed)
Transition of Care [facility]) Screening Note  Patient Details  Name: [name] Date of Birth: July 15, 1966  Transition of Care [facility]) CM/SW Contact:    Sherie Don, LCSW Phone Number: 02/25/2023, 8:53 AM  Transition of Care Department [facility]) has reviewed patient and no TOC needs have been identified at this time. We will continue to monitor patient advancement through interdisciplinary progression rounds. If new patient transition needs arise, please place a TOC consult.

## 2023-02-25 NOTE — Plan of Care (Signed)

## 2023-02-25 NOTE — Telephone Encounter (Signed)
Spoke with Becky Sanchez this morning. She states she is eating, drinking and urinating well. She has not had a BM yet but is passing gas. She is taking senokot as prescribed and encouraged her to drink plenty of water. She denies fever or chills. Incisions are dry and intact. She rates her pain 2-3/10. Her pain is controlled with Aleve and Tramadol    Instructed to call office with any fever, chills, purulent drainage, uncontrolled pain or any other questions or concerns. Patient verbalizes understanding.   Pt aware of post op appointments as well as the office number [phone] and after hours number [phone] to call if she has any questions or concerns

## 2023-02-25 NOTE — Progress Notes (Signed)
1 Day Post-Op Procedure(s) (LRB): XI ROBOTIC ASSISTED TOTAL HYSTERECTOMY WITH BILATERAL SALPINGO OOPHORECTOMY (N/A) SENTINEL NODE BIOPSY (N/A) POSSIBLE LYMPH NODE DISSECTION (N/A)  Subjective: Patient reports doing well this am. States she did not sleep well since she is used to sleeping with her finance. Voiding without difficulty. Feels steady when out of bed. No nausea or emesis reported. Tolerating diet. No flatus reported. Pain manageable and described as dull.     Objective: Vital signs in last 24 hours: Temp:  [97.5 F (36.4 C)-98.6 F (37 C)] 98.3 F (36.8 C) (02/28 0551) Pulse Rate:  [75-109] 97 (02/28 0551) Resp:  [13-26] 17 (02/28 0551) BP: (88-125)/(51-81) 114/60 (02/28 0551) SpO2:  [91 %-100 %] 97 % (02/28 0551) Weight:  [160 lb 15 oz (73 kg)] 160 lb 15 oz (73 kg) (02/27 0854)    Intake/Output from previous day: 02/27 0701 - 02/28 0700 In: 2651.4 [P.O.:480; I.V.:1821.4; IV Piggyback:350] Out: 950 [Urine:900; Blood:50]  Physical Examination: General: alert, cooperative, and no distress Resp: clear to auscultation bilaterally Cardio: regular rate and rhythm, S1, S2 normal, no murmur, click, rub or gallop GI: soft, non-tender; bowel sounds normal; no masses,  no organomegaly and incision: lap sites to the abdomen with dermabond intact with no drainage or bleeding Extremities: extremities normal, atraumatic, no cyanosis or edema  Labs: WBC/Hgb/Hct/Plts:  15.4/11.7/37.1/311 (02/28 [id number]) BUN/Cr/glu/ALT/AST/amyl/lip:  11/0.93/--/--/--/--/-- (02/28 [id number])  Assessment: 57 y.o. s/p Procedure(s): XI ROBOTIC ASSISTED TOTAL HYSTERECTOMY WITH BILATERAL SALPINGO OOPHORECTOMY SENTINEL NODE BIOPSY POSSIBLE LYMPH NODE DISSECTION: stable Pain:  Pain is well-controlled on PRN medications.  Heme: CBC stable on POD 1.  CV: BP and HR stable while inpatient.  GI:  Tolerating po: yes. Antiemetics ordered if needed.  GU: Voiding without difficulty. Creatinine 0.93 this am.   FEN:  No critical values on am labs.  Prophylaxis: SCDs in the room-pt did not wear overnight. Lovenox ordered.  Plan: Plan for discharge home. RN to give IS for home use.   LOS: 0 days    Dorothyann Gibbs 02/25/2023, 7:32 AM

## 2023-02-26 NOTE — Anesthesia Postprocedure Evaluation (Signed)
Anesthesia Post Note  Patient: Becky Sanchez  Procedure(Sanchez) Performed: XI ROBOTIC ASSISTED TOTAL HYSTERECTOMY WITH BILATERAL SALPINGO OOPHORECTOMY SENTINEL NODE BIOPSY POSSIBLE LYMPH NODE DISSECTION     Patient location during evaluation: PACU Anesthesia Type: General Level of consciousness: awake and alert Pain management: pain level controlled Vital Signs Assessment: post-procedure vital signs reviewed and stable Respiratory status: spontaneous breathing, nonlabored ventilation, respiratory function stable and patient connected to nasal cannula oxygen Cardiovascular status: blood pressure returned to baseline and stable Postop Assessment: no apparent nausea or vomiting Anesthetic complications: no  No notable events documented.  Last Vitals:  Vitals:   02/25/23 0551 02/25/23 0909  BP: 114/60 109/68  Pulse: 97 87  Resp: 17 14  Temp: 36.8 C 36.7 C  SpO2: 97% 93%    Last Pain:  Vitals:   02/25/23 0909  TempSrc: Oral  PainSc:                  Becky Sanchez

## 2023-02-27 ENCOUNTER — Other Ambulatory Visit: Payer: Self-pay

## 2023-03-02 LAB — SURGICAL PATHOLOGY

## 2023-03-09 ENCOUNTER — Inpatient Hospital Stay: Payer: BC Managed Care – PPO | Attending: Psychiatry | Admitting: Psychiatry

## 2023-03-09 VITALS — BP 120/82 | HR 86 | Temp 98.3°F | Resp 14 | Wt 163.0 lb

## 2023-03-09 DIAGNOSIS — C541 Malignant neoplasm of endometrium: Secondary | ICD-10-CM

## 2023-03-09 DIAGNOSIS — Z90722 Acquired absence of ovaries, bilateral: Secondary | ICD-10-CM | POA: Diagnosis not present

## 2023-03-09 DIAGNOSIS — Z8542 Personal history of malignant neoplasm of other parts of uterus: Secondary | ICD-10-CM | POA: Diagnosis not present

## 2023-03-09 DIAGNOSIS — Z90710 Acquired absence of both cervix and uterus: Secondary | ICD-10-CM | POA: Diagnosis not present

## 2023-03-09 DIAGNOSIS — Z7189 Other specified counseling: Secondary | ICD-10-CM

## 2023-03-09 NOTE — Progress Notes (Unsigned)
Gynecologic Oncology Return Clinic Visit  Date of Service: 03/09/2023 Referring Provider: Judy Pimple, NP [address] [place],  [place] [address]***  Assessment & Plan: Becky Sanchez is a 57 y.o. woman with Stage *** who is *** weeks s/p *** on ***.  Postop: - Pt recovering well from surgery and healing appropriately postoperatively - Intraoperative findings and pathology results reviewed. - Ongoing postoperative expectations and precautions reviewed. Continue with no lifting >10lbs through 6 weeks postoperatively - Pt works ***. Okay to return to work at [employer] - Given that uterus is in situ, pt advised that she should continue with pap smear screening per routine until age 66 if she continues with negative/low grade paps.  - Reviewed that after 12 months without menstrual cycles, she should not have any spotting or bleeding.  If this were to occur, she should be evaluated for postmenopausal bleeding.  ***Endometrial cancer: - Pathology results reviewed in detail - No high-intermediate risk factors. - Recommend observation. - Signs/symptoms of recurrence reviewed. - Surveillance reviewed. Follow-up q6 months x2 years then yearly.    RTC ***.  [name], MD Gynecologic Oncology   Medical Decision Making I personally spent  TOTAL *** minutes face-to-face and non-face-to-face in the care of this patient, which includes all pre, intra, and post visit time on the date of service.  *** minutes spent reviewing records prior to the visit *** Minutes in patient contact      *** minutes in other billable services *** minutes charting , conferring with consultants etc.   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 Heterogeneous thickening of the endometrium compatible with patient's given history of endometrial carcinoma. 2. No definite evidence of metastatic disease in the abdomen or pelvis. 3. Subcentimeter left adrenal nodule is nonspecific but favored to reflect a benign adrenal adenoma. Consider attention on follow-up imaging. 4. Mild bladder wall thickening with subtle urothelial hyperenhancement, which may reflect cystitis. Correlate with urinalysis. 5. Diffuse hepatic steatosis. 6. Left-sided colonic diverticulosis without findings of acute diverticulitis.     Interval History: Pt reports that she is recovering well from surgery. She is using aleve and BC powder as needed (not at same time) for pain. She is eating and drinking well. She is voiding without issue and having regular bowel movements. No bleeding.  ***right groin numbness  Past Medical/Surgical History: Past Medical History:  Diagnosis Date   Arthritis    Depression    [place]ometrial cancer ([facility]    Fractures    Migraine    Pneumonia     Past Surgical History:  Procedure Laterality Date   LYMPH NODE DISSECTION N/A 02/24/2023   Procedure: POSSIBLE LYMPH NODE DISSECTION;  Surgeon: NBernadene Bell MD;  Location: [facility];  Service: Gynecology;  Laterality: N/A;   ROBOTIC ASSISTED TOTAL HYSTERECTOMY WITH BILATERAL SALPINGO OOPHERECTOMY N/A 02/24/2023   Procedure: XI ROBOTIC ASSISTED TOTAL HYSTERECTOMY WITH BILATERAL SALPINGO OOPHORECTOMY;  Surgeon: NBernadene Bell MD;  Location: [facility];  Service: Gynecology;  Laterality: N/A;   SENTINEL NODE BIOPSY N/A 02/24/2023   Procedure: SENTINEL NODE BIOPSY;  Surgeon: NBernadene Bell MD;  Location: [facility];  Service: Gynecology;  Laterality: N/A;  TUBAL LIGATION     WRIST SURGERY Right     Family History  Problem Relation Age of Onset   Thyroid disease Mother    Heart disease Maternal Grandmother    Alzheimer's  disease Maternal Grandmother    Dementia Maternal Grandfather    Prostate cancer Paternal Grandfather    Breast cancer Neg Hx    Ovarian cancer Neg Hx    Endometrial cancer Neg Hx    Colon cancer Neg Hx     Social History   Socioeconomic History   Marital status: Divorced    Spouse name: Not on file   Number of children: Not on file   Years of education: Not on file   Highest education level: Not on file  Occupational History   Not on file  Tobacco Use   Smoking status: Every Day    Packs/day: 0.25    Years: 30.00    Total pack years: 7.50    Types: Cigarettes   Smokeless tobacco: Never  Vaping Use   Vaping Use: Never used  Substance and Sexual Activity   Alcohol use: Yes    Comment: Rare   Drug use: Never   Sexual activity: Yes    Birth control/protection: Post-menopausal  Other Topics Concern   Not on file  Social History Narrative   Not on file   Social Determinants of Health   Financial Resource Strain: Not on file  Food Insecurity: Not on file  Transportation Needs: Unmet Transportation Needs (02/05/2023)   PRAPARE - Hydrologist (Medical): Yes    Lack of Transportation (Non-Medical): No  Physical Activity: Not on file  Stress: Not on file  Social Connections: Not on file    Current Medications:  Current Outpatient Medications:    Glucos-Chond-Hyal Ac-Ca Fructo (MOVE FREE JOINT HEALTH ADVANCE PO), Take 1 tablet by mouth daily., Disp: , Rfl:    Multiple Vitamin (MULTIVITAMIN WITH MINERALS) TABS tablet, Take 1 tablet by mouth daily., Disp: , Rfl:    naproxen sodium (ALEVE) 220 MG tablet, Take 220 mg by mouth daily as needed (pain)., Disp: , Rfl:    senna-docusate (SENOKOT-S) 8.6-50 MG tablet, Take 2 tablets by mouth at bedtime. For AFTER surgery, do not take if having diarrhea, Disp: 30 tablet, Rfl: 0   trolamine salicylate (ASPERCREME) 10 % cream, Apply 1 Application topically as needed for muscle pain., Disp: , Rfl:   Review  of Symptoms: Complete 10-system review is negative except ***as above in Interval History.  Physical Exam: There were no vitals taken for this visit. General: ***Alert, oriented, no acute distress. HEENT: ***Normocephalic, atraumatic. Neck symmetric without masses. Sclera anicteric.  Chest: ***Normal work of breathing. ***Clear to auscultation bilaterally.   Cardiovascular: ***Regular rate and rhythm, no murmurs. Abdomen: ***Soft, nontender.  Normoactive bowel sounds.  No masses or hepatosplenomegaly appreciated.  ***Well-healing incision***s. Extremities: ***Grossly normal range of motion.  Warm, well perfused.  No edema bilaterally. Skin: ***No rashes or lesions noted. GU: Normal appearing external genitalia without erythema, excoriation, or lesions.  Speculum exam reveals ***.  Bimanual exam reveals ***. Exam chaperoned by ***  Laboratory & Radiologic Studies: ***

## 2023-03-09 NOTE — Patient Instructions (Signed)
It was a pleasure to see you in clinic today. - You are healing well. - No lifting >10lb until 6 weeks after surgery. Nothing in the vagina until 8 weeks after surgery. - Return visit planned for 6 months.  Thank you very much for allowing me to provide care for you today.  I appreciate your confidence in choosing our Gynecologic Oncology team at [facility].  If you have any questions about your visit today please call our office or send [place] a MyChart message and we will get back to you as soon as possible.

## 2023-03-11 ENCOUNTER — Telehealth: Payer: Self-pay

## 2023-03-11 ENCOUNTER — Encounter: Payer: Self-pay | Admitting: Psychiatry

## 2023-03-11 NOTE — Telephone Encounter (Signed)
Return to work paperwork received from [employer]. Paperwork completed and faxed back to number on paperwork with failed attempt x3.  Pt aware, via phone, I emailed them to her so she could take them to the appropriate department.

## 2024-01-25 ENCOUNTER — Telehealth: Payer: Self-pay | Admitting: Oncology

## 2024-01-25 NOTE — Telephone Encounter (Signed)
Left a message to schedule a follow up visit with Dr. Alvester Morin.  Requested a return call.

## 2024-01-26 NOTE — Telephone Encounter (Signed)
Left another message to schedule a follow up visit with Dr. Alvester Morin.  Requested a return call.

## 2024-01-29 ENCOUNTER — Encounter: Payer: Self-pay | Admitting: Oncology

## 2024-02-04 NOTE — Telephone Encounter (Signed)
 Left another message to schedule a follow up appointment with Dr. Daisey Dryer.

## 2024-02-19 ENCOUNTER — Encounter: Payer: Self-pay | Admitting: Oncology

## 2024-02-22 ENCOUNTER — Encounter: Payer: Self-pay | Admitting: Oncology

## 2024-02-22 NOTE — Progress Notes (Signed)
 Certified letter sent to patient regarding scheduling a follow up appointment with Dr. Alvester Morin.
